# Patient Record
Sex: Female | Born: 1955 | Race: Black or African American | Hispanic: Refuse to answer | Smoking: Never smoker
Health system: Southern US, Community
[De-identification: ages and names within clinical notes are randomized; demographics above are authoritative.]

## PROBLEM LIST (undated history)

## (undated) DIAGNOSIS — M1990 Unspecified osteoarthritis, unspecified site: Secondary | ICD-10-CM

## (undated) DIAGNOSIS — I10 Essential (primary) hypertension: Secondary | ICD-10-CM

## (undated) DIAGNOSIS — E785 Hyperlipidemia, unspecified: Secondary | ICD-10-CM

## (undated) DIAGNOSIS — G4730 Sleep apnea, unspecified: Secondary | ICD-10-CM

## (undated) DIAGNOSIS — E119 Type 2 diabetes mellitus without complications: Secondary | ICD-10-CM

## (undated) HISTORY — PX: BREAST LUMPECTOMY: SHX2

## (undated) HISTORY — DX: Unspecified osteoarthritis, unspecified site: M19.90

## (undated) HISTORY — DX: Sleep apnea, unspecified: G47.30

## (undated) HISTORY — DX: Hyperlipidemia, unspecified: E78.5

---

## 1998-05-11 ENCOUNTER — Ambulatory Visit (HOSPITAL_COMMUNITY): Admission: RE | Admit: 1998-05-11 | Discharge: 1998-05-11 | Payer: Self-pay | Admitting: Family Medicine

## 1998-05-11 ENCOUNTER — Encounter: Payer: Self-pay | Admitting: Family Medicine

## 1998-10-31 ENCOUNTER — Other Ambulatory Visit: Admission: RE | Admit: 1998-10-31 | Discharge: 1998-10-31 | Payer: Self-pay | Admitting: Family Medicine

## 1999-06-02 ENCOUNTER — Ambulatory Visit (HOSPITAL_COMMUNITY): Admission: RE | Admit: 1999-06-02 | Discharge: 1999-06-02 | Payer: Self-pay | Admitting: Family Medicine

## 1999-06-02 ENCOUNTER — Encounter: Payer: Self-pay | Admitting: Family Medicine

## 1999-12-12 ENCOUNTER — Other Ambulatory Visit: Admission: RE | Admit: 1999-12-12 | Discharge: 1999-12-12 | Payer: Self-pay | Admitting: Obstetrics and Gynecology

## 2000-10-22 ENCOUNTER — Ambulatory Visit (HOSPITAL_COMMUNITY): Admission: RE | Admit: 2000-10-22 | Discharge: 2000-10-22 | Payer: Self-pay | Admitting: Family Medicine

## 2000-10-22 ENCOUNTER — Encounter: Payer: Self-pay | Admitting: Family Medicine

## 2000-12-24 ENCOUNTER — Other Ambulatory Visit: Admission: RE | Admit: 2000-12-24 | Discharge: 2000-12-24 | Payer: Self-pay | Admitting: Obstetrics and Gynecology

## 2001-10-23 ENCOUNTER — Ambulatory Visit (HOSPITAL_COMMUNITY): Admission: RE | Admit: 2001-10-23 | Discharge: 2001-10-23 | Payer: Self-pay | Admitting: Family Medicine

## 2001-10-23 ENCOUNTER — Encounter: Payer: Self-pay | Admitting: Family Medicine

## 2002-10-26 ENCOUNTER — Ambulatory Visit (HOSPITAL_COMMUNITY): Admission: RE | Admit: 2002-10-26 | Discharge: 2002-10-26 | Payer: Self-pay | Admitting: Obstetrics and Gynecology

## 2002-10-26 ENCOUNTER — Encounter: Payer: Self-pay | Admitting: Obstetrics and Gynecology

## 2003-09-27 ENCOUNTER — Encounter: Admission: RE | Admit: 2003-09-27 | Discharge: 2003-09-27 | Payer: Self-pay | Admitting: Internal Medicine

## 2003-12-15 ENCOUNTER — Encounter: Admission: RE | Admit: 2003-12-15 | Discharge: 2003-12-15 | Payer: Self-pay | Admitting: Internal Medicine

## 2003-12-30 ENCOUNTER — Ambulatory Visit (HOSPITAL_COMMUNITY): Admission: RE | Admit: 2003-12-30 | Discharge: 2003-12-30 | Payer: Self-pay | Admitting: Obstetrics and Gynecology

## 2004-07-17 ENCOUNTER — Ambulatory Visit (HOSPITAL_COMMUNITY): Admission: RE | Admit: 2004-07-17 | Discharge: 2004-07-17 | Payer: Self-pay | Admitting: Pulmonary Disease

## 2005-01-02 ENCOUNTER — Ambulatory Visit (HOSPITAL_COMMUNITY): Admission: RE | Admit: 2005-01-02 | Discharge: 2005-01-02 | Payer: Self-pay | Admitting: Pulmonary Disease

## 2005-01-15 ENCOUNTER — Encounter: Admission: RE | Admit: 2005-01-15 | Discharge: 2005-01-15 | Payer: Self-pay | Admitting: Pulmonary Disease

## 2005-09-03 ENCOUNTER — Encounter: Admission: RE | Admit: 2005-09-03 | Discharge: 2005-09-03 | Payer: Self-pay | Admitting: Orthopaedic Surgery

## 2005-09-21 ENCOUNTER — Encounter: Admission: RE | Admit: 2005-09-21 | Discharge: 2005-09-21 | Payer: Self-pay | Admitting: *Deleted

## 2005-10-11 IMAGING — CR DG LUMBAR SPINE COMPLETE 4+V
5 series · 5 of 5 positions shown · non-contrast
Comparison: none

CLINICAL DATA: Chronic back pain radiating to both hips.
 LUMBAR SPINE - 5 VIEW:
 Five film study of the lumbar spine in four projections reveals moderate scoliosis of the thoracolumbar junction with a convexity to the right.  Satisfactory alignment noted in the lateral projection.  Slight degenerative changes are noted without acute or focal abnormality.  The pars intra-articulares appear intact.

[t l-spine a.p.]
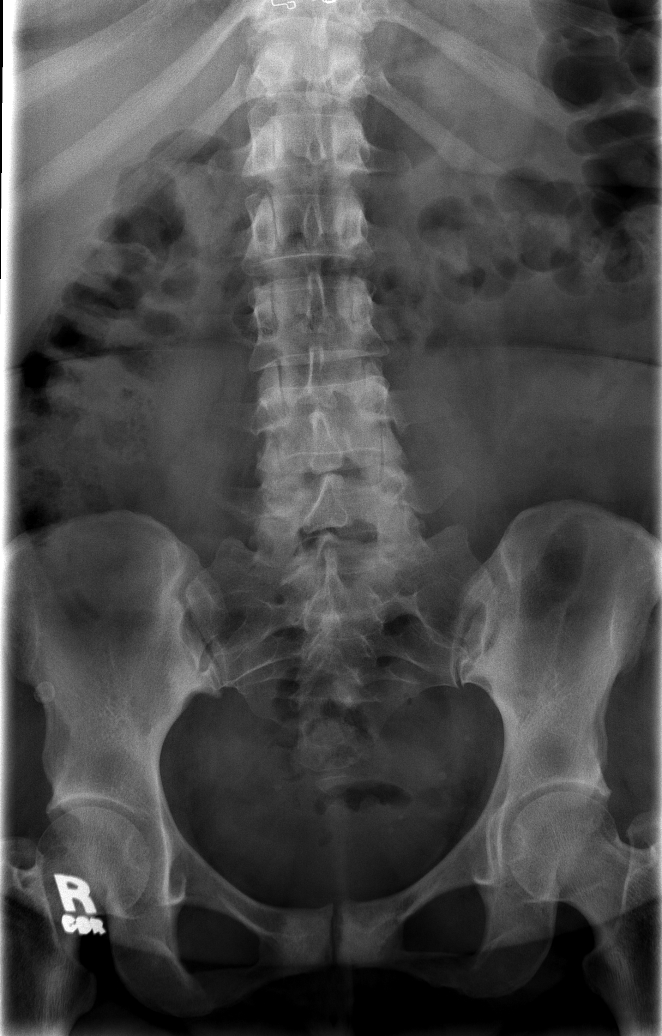

[t l-spine oblique exposure]
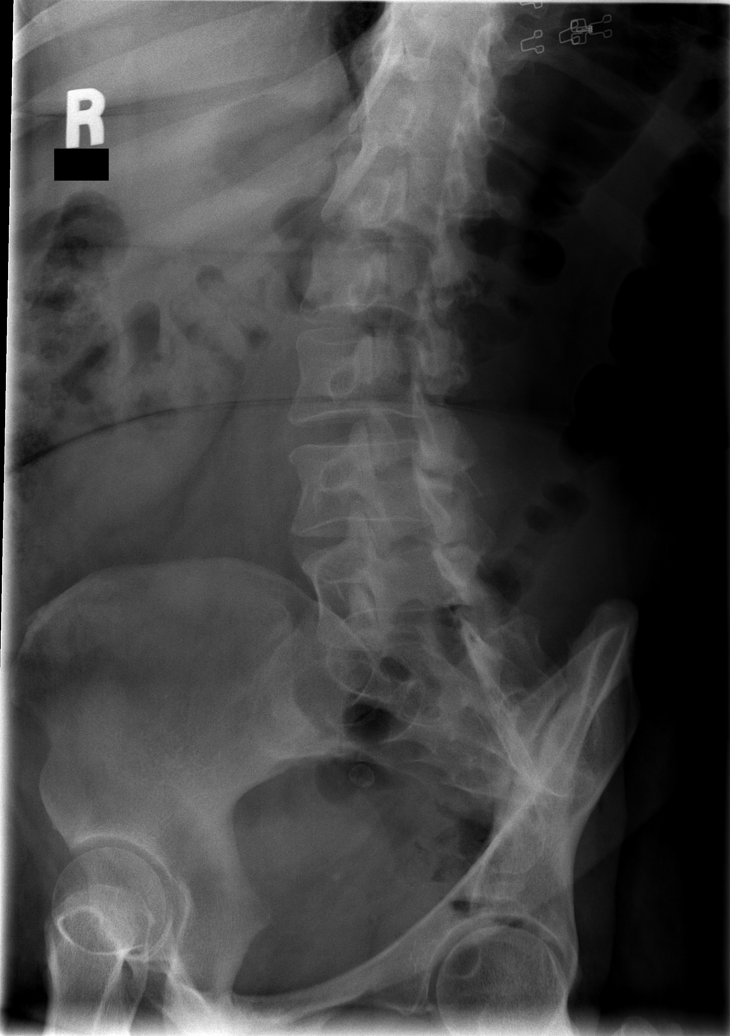

[t l-spine oblique exposure *]
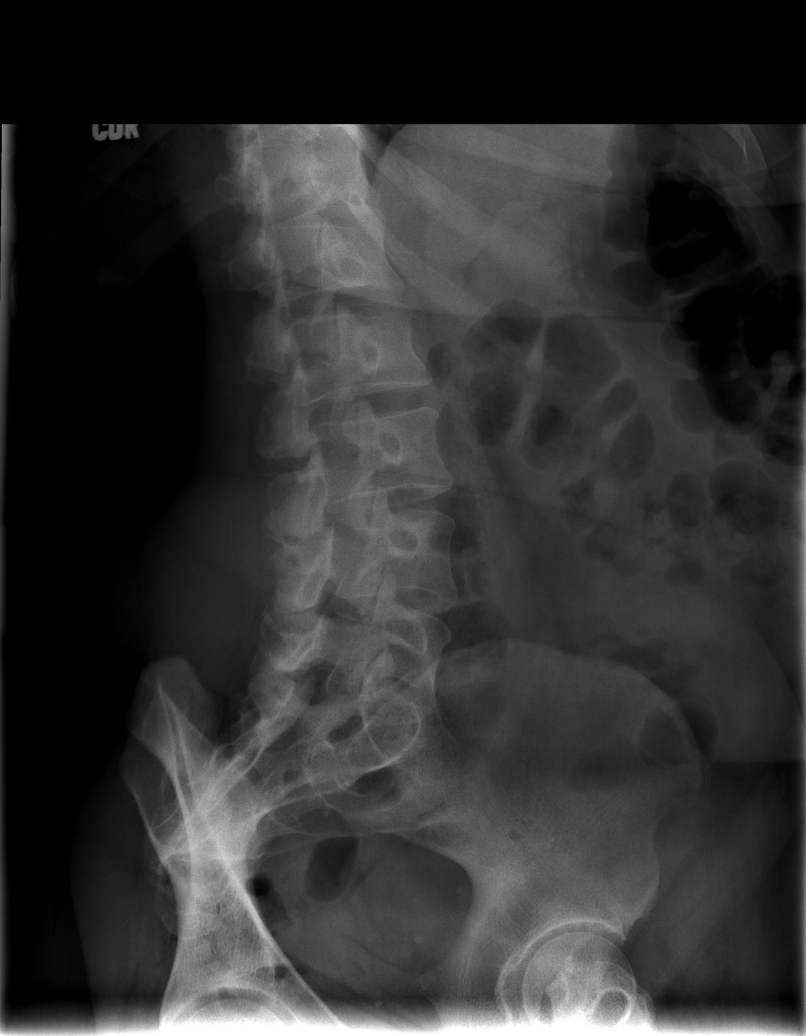

[t l-spine lat]
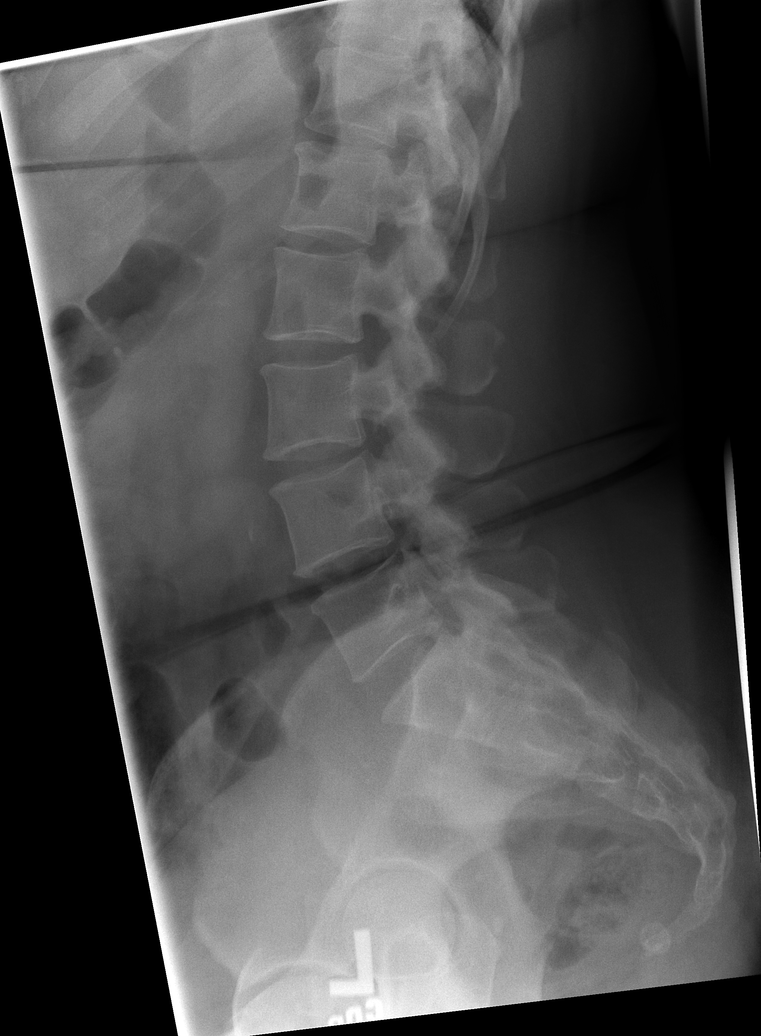

[t l-spine l5-s1 spot]
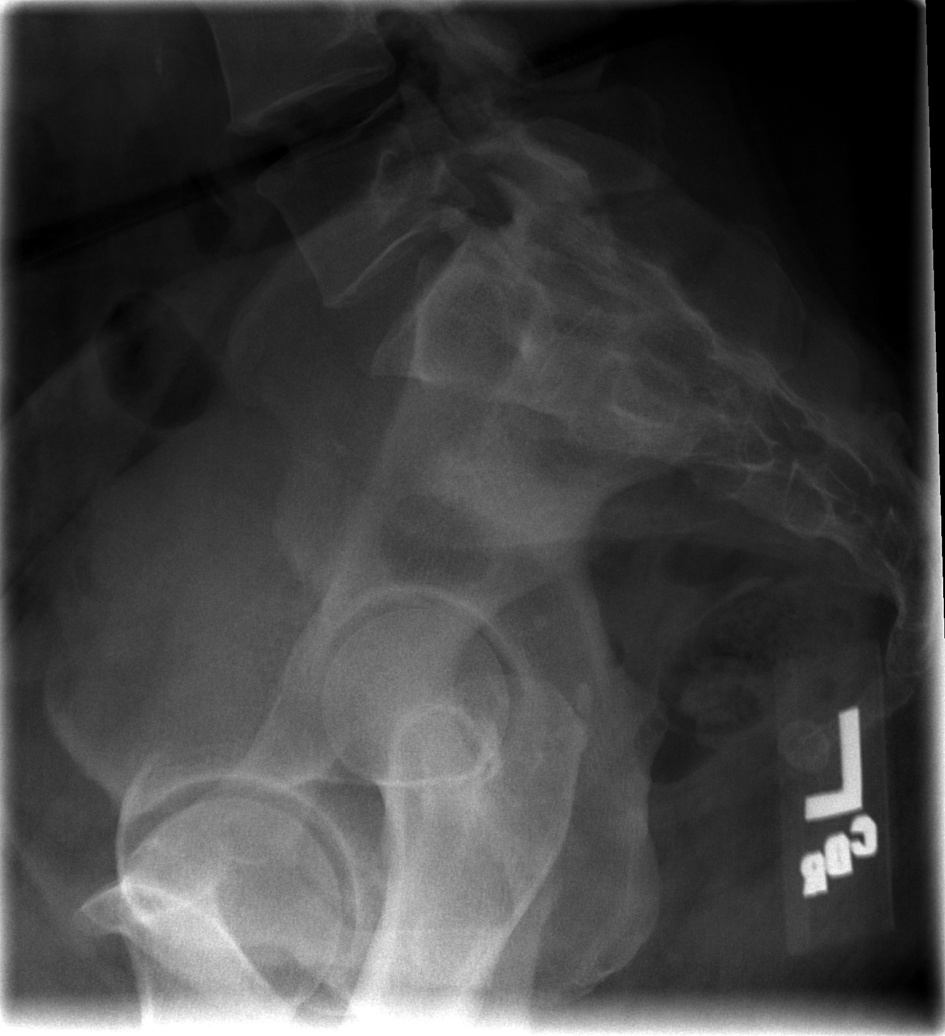

[5 of 5 positions shown; findings below may reference images not displayed]

IMPRESSION: Scoliosis of the thoracolumbar spine is noted with a convexity to the right.  Degenerative changes are noted without acute or focal abnormality.

## 2006-01-18 ENCOUNTER — Ambulatory Visit (HOSPITAL_COMMUNITY): Admission: RE | Admit: 2006-01-18 | Discharge: 2006-01-18 | Payer: Self-pay | Admitting: Pulmonary Disease

## 2007-01-21 ENCOUNTER — Ambulatory Visit (HOSPITAL_COMMUNITY): Admission: RE | Admit: 2007-01-21 | Discharge: 2007-01-21 | Payer: Self-pay | Admitting: Pulmonary Disease

## 2008-01-22 ENCOUNTER — Ambulatory Visit (HOSPITAL_COMMUNITY): Admission: RE | Admit: 2008-01-22 | Discharge: 2008-01-22 | Payer: Self-pay | Admitting: Pulmonary Disease

## 2009-02-03 ENCOUNTER — Ambulatory Visit (HOSPITAL_COMMUNITY): Admission: RE | Admit: 2009-02-03 | Discharge: 2009-02-03 | Payer: Self-pay | Admitting: Pulmonary Disease

## 2010-02-06 ENCOUNTER — Ambulatory Visit (HOSPITAL_COMMUNITY)
Admission: RE | Admit: 2010-02-06 | Discharge: 2010-02-06 | Payer: Self-pay | Source: Home / Self Care | Admitting: Pulmonary Disease

## 2010-11-06 ENCOUNTER — Inpatient Hospital Stay (INDEPENDENT_AMBULATORY_CARE_PROVIDER_SITE_OTHER)
Admission: RE | Admit: 2010-11-06 | Discharge: 2010-11-06 | Disposition: A | Discharge status: Home / Self Care | Payer: BC Managed Care – PPO | Source: Ambulatory Visit | Attending: Family Medicine | Admitting: Family Medicine

## 2010-11-06 DIAGNOSIS — R6889 Other general symptoms and signs: Secondary | ICD-10-CM

## 2011-01-02 ENCOUNTER — Other Ambulatory Visit (HOSPITAL_COMMUNITY): Payer: Self-pay | Admitting: Pulmonary Disease

## 2011-01-02 DIAGNOSIS — Z1231 Encounter for screening mammogram for malignant neoplasm of breast: Secondary | ICD-10-CM

## 2011-02-08 ENCOUNTER — Ambulatory Visit (HOSPITAL_COMMUNITY)
Admission: RE | Admit: 2011-02-08 | Discharge: 2011-02-08 | Disposition: A | Discharge status: Home / Self Care | Payer: BC Managed Care – PPO | Source: Ambulatory Visit | Attending: Pulmonary Disease | Admitting: Pulmonary Disease

## 2011-02-08 DIAGNOSIS — Z1231 Encounter for screening mammogram for malignant neoplasm of breast: Secondary | ICD-10-CM

## 2011-03-13 ENCOUNTER — Other Ambulatory Visit (HOSPITAL_COMMUNITY): Payer: Self-pay | Admitting: Pulmonary Disease

## 2011-03-13 DIAGNOSIS — R1030 Lower abdominal pain, unspecified: Secondary | ICD-10-CM

## 2011-03-14 ENCOUNTER — Inpatient Hospital Stay (HOSPITAL_COMMUNITY): Admission: RE | Admit: 2011-03-14 | Payer: BC Managed Care – PPO | Source: Ambulatory Visit

## 2011-03-14 ENCOUNTER — Ambulatory Visit (HOSPITAL_COMMUNITY)
Admission: RE | Admit: 2011-03-14 | Discharge: 2011-03-14 | Disposition: A | Discharge status: Home / Self Care | Payer: 59 | Source: Ambulatory Visit | Attending: Pulmonary Disease | Admitting: Pulmonary Disease

## 2011-03-14 ENCOUNTER — Other Ambulatory Visit (HOSPITAL_COMMUNITY): Payer: BC Managed Care – PPO

## 2011-03-14 DIAGNOSIS — R1030 Lower abdominal pain, unspecified: Secondary | ICD-10-CM

## 2011-03-14 DIAGNOSIS — D259 Leiomyoma of uterus, unspecified: Secondary | ICD-10-CM | POA: Insufficient documentation

## 2011-03-14 DIAGNOSIS — R109 Unspecified abdominal pain: Secondary | ICD-10-CM | POA: Insufficient documentation

## 2012-02-04 ENCOUNTER — Other Ambulatory Visit (HOSPITAL_COMMUNITY): Payer: Self-pay | Admitting: Pulmonary Disease

## 2012-02-04 DIAGNOSIS — Z1231 Encounter for screening mammogram for malignant neoplasm of breast: Secondary | ICD-10-CM

## 2012-02-18 ENCOUNTER — Ambulatory Visit (HOSPITAL_COMMUNITY)
Admission: RE | Admit: 2012-02-18 | Discharge: 2012-02-18 | Disposition: A | Discharge status: Home / Self Care | Payer: 59 | Source: Ambulatory Visit | Attending: Pulmonary Disease | Admitting: Pulmonary Disease

## 2012-02-18 DIAGNOSIS — Z1231 Encounter for screening mammogram for malignant neoplasm of breast: Secondary | ICD-10-CM

## 2012-07-17 ENCOUNTER — Emergency Department (INDEPENDENT_AMBULATORY_CARE_PROVIDER_SITE_OTHER): Payer: 59

## 2012-07-17 ENCOUNTER — Emergency Department (INDEPENDENT_AMBULATORY_CARE_PROVIDER_SITE_OTHER)
Admission: EM | Admit: 2012-07-17 | Discharge: 2012-07-17 | Disposition: A | Discharge status: Home / Self Care | Payer: 59 | Source: Home / Self Care

## 2012-07-17 ENCOUNTER — Encounter (HOSPITAL_COMMUNITY): Payer: Self-pay

## 2012-07-17 DIAGNOSIS — M79671 Pain in right foot: Secondary | ICD-10-CM

## 2012-07-17 DIAGNOSIS — M79609 Pain in unspecified limb: Secondary | ICD-10-CM

## 2012-07-17 HISTORY — DX: Essential (primary) hypertension: I10

## 2012-07-17 HISTORY — DX: Type 2 diabetes mellitus without complications: E11.9

## 2012-07-17 MED ORDER — NAPROXEN 500 MG PO TABS: 500.0000 mg | ORAL_TABLET | Freq: Two times a day (BID) | ORAL | Status: DC

## 2012-07-17 MED ORDER — HYDROCODONE-ACETAMINOPHEN 5-325 MG PO TABS: 1.0000 | ORAL_TABLET | Freq: Four times a day (QID) | ORAL | Status: DC | PRN

## 2012-07-17 NOTE — ED Notes (Signed)
State she woke this AM w pain in her right foot; no known injury. 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

## 2012-07-17 NOTE — ED Provider Notes (Signed)
History     CSN: [id number]  Arrival date & time 07/17/12  1359   First MD Initiated Contact with Patient 07/17/12 1614      Chief Complaint  Patient presents with  . Foot Pain    (Consider location/radiation/quality/duration/timing/severity/associated sxs/prior treatment) Patient is a 57 y.o. female presenting with lower extremity pain.  Foot Pain   Patient is a 57 year old female with history of hypertension, diabetes presented to urgent care with swelling and pain in her right foot that started this morning around 9:30 AM. Patient reports that she woke up today and noticed right foot swelling and then significant pain on bearing weight. Patient denies any fall, tripping on the right foot/ankle or any bug bite. She denied any fevers, chills. She denies any prior history of gout.   Past Medical History  Diagnosis Date  . Hypertension   . Diabetes mellitus without complication     History reviewed. No pertinent past surgical history.  History reviewed. No pertinent family history.  History  Substance Use Topics  . Smoking status: Not on file  . Smokeless tobacco: Not on file  . Alcohol Use: Not on file    OB History   Grav Para Term Preterm Abortions TAB SAB Ect Mult Living                  Review of Systems  Constitutional: Negative.   HENT: Negative.   Eyes: Negative.   Respiratory: Negative.   Cardiovascular: Negative.   Gastrointestinal: Negative.   Endocrine: Negative.   Genitourinary: Negative.   Musculoskeletal: Positive for joint swelling, arthralgias and gait problem.       In the right foot, please see history of present illness  Neurological: Negative.     Allergies  Review of patient's allergies indicates no known allergies.  Home Medications   Current Outpatient Rx  Name  Route  Sig  Dispense  Refill  . aspirin 81 MG tablet   Oral   Take 81 mg by mouth daily.         . metFORMIN (GLUCOPHAGE) 500 MG tablet   Oral   Take 500 mg by  mouth 2 (two) times daily with a meal.         . metoprolol succinate (TOPROL-XL) 100 MG 24 hr tablet   Oral   Take 100 mg by mouth daily. Take with or immediately following a meal.           BP 159/76  Pulse 74  Temp(Src) 98.2 F (36.8 C) (Oral)  Resp 16  SpO2 100%  Physical Exam  Constitutional: She appears well-developed and well-nourished.  HENT:  Head: Normocephalic and atraumatic.  Eyes: EOM are normal. Pupils are equal, round, and reactive to light.  Neck: Neck supple.  Cardiovascular: Normal rate and regular rhythm.   Pulmonary/Chest: Effort normal and breath sounds normal.  Abdominal: Soft. Bowel sounds are normal.  Musculoskeletal:  Right foot is swollen mostly around the ankle joint, slight ROM limitation on abduction and adduction due to pain    ED Course  Procedures (including critical care time)  Labs Reviewed  SEDIMENTATION RATE  URIC ACID   Dg Foot Complete Right  07/17/2012   *RADIOLOGY REPORT*  Clinical Data: Right foot pain anteriorly radiating posteriorly when walking or weightbearing, no known injury  RIGHT FOOT COMPLETE - 3+ VIEW  Comparison: None  Findings: Osseous mineralization normal. Joint spaces preserved. Plantar and Achilles insertion calcaneal spurs. No acute fracture, dislocation or bone destruction.  IMPRESSION: Calcaneal spurring. No acute osseous abnormalities.   Original Report Authenticated By: Ulyses Southward, M.D.     1. Right foot pain       MDM  Right foot pain with swelling- sudden onset without any trauma, does not fit cellulitis picture - right foot x-ray shows no acute fracture, dislocation or bone destruction, calcaneal spurring - ESR, serum uric acid still pending - Will prescribe naprosyn and hydrocodone PRN  (#30, no refills) for pain. I will followup on ESR, uric acid level today, if patient has gout, I will call in prescription for colchicine to her pharmacy, [facility] [address] - Patient was advised to rest, elevate  her foot, weight bearing as tolerated, ice pack to reduce swelling and pain.   RAI,RIPUDEEP M.D. [facility] 07/17/2012, 5:16 PM  Pager: [phone]          Cathren Harsh, MD 07/17/12 [phone]

## 2012-09-22 ENCOUNTER — Encounter (INDEPENDENT_AMBULATORY_CARE_PROVIDER_SITE_OTHER): Payer: 59 | Admitting: Ophthalmology

## 2012-09-22 DIAGNOSIS — H2510 Age-related nuclear cataract, unspecified eye: Secondary | ICD-10-CM

## 2012-09-22 DIAGNOSIS — H43819 Vitreous degeneration, unspecified eye: Secondary | ICD-10-CM

## 2012-09-22 DIAGNOSIS — E1165 Type 2 diabetes mellitus with hyperglycemia: Secondary | ICD-10-CM

## 2012-09-22 DIAGNOSIS — I10 Essential (primary) hypertension: Secondary | ICD-10-CM

## 2012-09-22 DIAGNOSIS — H33309 Unspecified retinal break, unspecified eye: Secondary | ICD-10-CM

## 2012-09-22 DIAGNOSIS — E1139 Type 2 diabetes mellitus with other diabetic ophthalmic complication: Secondary | ICD-10-CM

## 2012-09-22 DIAGNOSIS — H35039 Hypertensive retinopathy, unspecified eye: Secondary | ICD-10-CM

## 2012-09-22 DIAGNOSIS — E11319 Type 2 diabetes mellitus with unspecified diabetic retinopathy without macular edema: Secondary | ICD-10-CM

## 2012-10-08 ENCOUNTER — Ambulatory Visit (INDEPENDENT_AMBULATORY_CARE_PROVIDER_SITE_OTHER): Payer: 59 | Admitting: Ophthalmology

## 2012-10-16 ENCOUNTER — Ambulatory Visit (INDEPENDENT_AMBULATORY_CARE_PROVIDER_SITE_OTHER): Payer: 59 | Admitting: Ophthalmology

## 2012-10-16 DIAGNOSIS — H33309 Unspecified retinal break, unspecified eye: Secondary | ICD-10-CM

## 2013-01-12 ENCOUNTER — Other Ambulatory Visit (HOSPITAL_COMMUNITY): Payer: Self-pay | Admitting: Pulmonary Disease

## 2013-01-12 DIAGNOSIS — Z1231 Encounter for screening mammogram for malignant neoplasm of breast: Secondary | ICD-10-CM

## 2013-02-16 ENCOUNTER — Ambulatory Visit (INDEPENDENT_AMBULATORY_CARE_PROVIDER_SITE_OTHER): Payer: 59 | Admitting: Ophthalmology

## 2013-02-16 DIAGNOSIS — E1139 Type 2 diabetes mellitus with other diabetic ophthalmic complication: Secondary | ICD-10-CM

## 2013-02-16 DIAGNOSIS — H2510 Age-related nuclear cataract, unspecified eye: Secondary | ICD-10-CM

## 2013-02-16 DIAGNOSIS — H43819 Vitreous degeneration, unspecified eye: Secondary | ICD-10-CM

## 2013-02-16 DIAGNOSIS — I10 Essential (primary) hypertension: Secondary | ICD-10-CM

## 2013-02-16 DIAGNOSIS — E11319 Type 2 diabetes mellitus with unspecified diabetic retinopathy without macular edema: Secondary | ICD-10-CM

## 2013-02-16 DIAGNOSIS — H33309 Unspecified retinal break, unspecified eye: Secondary | ICD-10-CM

## 2013-02-16 DIAGNOSIS — H35039 Hypertensive retinopathy, unspecified eye: Secondary | ICD-10-CM

## 2013-02-17 ENCOUNTER — Other Ambulatory Visit: Payer: Self-pay | Admitting: Family Medicine

## 2013-02-17 DIAGNOSIS — R32 Unspecified urinary incontinence: Secondary | ICD-10-CM

## 2013-02-23 ENCOUNTER — Ambulatory Visit (HOSPITAL_COMMUNITY)
Admission: RE | Admit: 2013-02-23 | Discharge: 2013-02-23 | Disposition: A | Discharge status: Home / Self Care | Payer: 59 | Source: Ambulatory Visit | Attending: Pulmonary Disease | Admitting: Pulmonary Disease

## 2013-02-23 DIAGNOSIS — Z1231 Encounter for screening mammogram for malignant neoplasm of breast: Secondary | ICD-10-CM

## 2013-02-24 ENCOUNTER — Ambulatory Visit
Admission: RE | Admit: 2013-02-24 | Discharge: 2013-02-24 | Disposition: A | Discharge status: Home / Self Care | Payer: 59 | Source: Ambulatory Visit | Attending: Family Medicine | Admitting: Family Medicine

## 2013-02-24 DIAGNOSIS — R32 Unspecified urinary incontinence: Secondary | ICD-10-CM

## 2013-06-02 ENCOUNTER — Encounter (HOSPITAL_COMMUNITY): Payer: Self-pay | Admitting: Emergency Medicine

## 2013-06-02 ENCOUNTER — Emergency Department (INDEPENDENT_AMBULATORY_CARE_PROVIDER_SITE_OTHER)
Admission: EM | Admit: 2013-06-02 | Discharge: 2013-06-02 | Disposition: A | Discharge status: Home / Self Care | Payer: 59 | Source: Home / Self Care | Attending: Emergency Medicine | Admitting: Emergency Medicine

## 2013-06-02 ENCOUNTER — Emergency Department (INDEPENDENT_AMBULATORY_CARE_PROVIDER_SITE_OTHER): Payer: 59

## 2013-06-02 DIAGNOSIS — S93409A Sprain of unspecified ligament of unspecified ankle, initial encounter: Secondary | ICD-10-CM

## 2013-06-02 MED ORDER — IBUPROFEN 800 MG PO TABS
800.0000 mg | ORAL_TABLET | Freq: Once | ORAL | Status: AC
  Administered 2013-06-02: 800 mg via ORAL

## 2013-06-02 MED ORDER — HYDROCODONE-ACETAMINOPHEN 5-325 MG PO TABS: ORAL_TABLET | ORAL | Status: DC

## 2013-06-02 MED ORDER — IBUPROFEN 800 MG PO TABS
ORAL_TABLET | ORAL | Status: AC
  Filled 2013-06-02: qty 1

## 2013-06-02 NOTE — ED Provider Notes (Signed)
Chief Complaint   Chief Complaint  Patient presents with  . Ankle Pain    History of Present Illness   Shelly Garza is a 58 year old female who injured her right ankle today. She tripped while getting into her car on some uneven pavement while she was having an oil change. She fell to the ground. She's not sure how she injured the ankle. She did not hear a pop. The entire ankle has been swollen since then and tender to touch and with movement. There is no numbness or tingling. She is able to bear weight, but it hurts.  Review of Systems   Other than as noted above, the patient denies any of the following symptoms: Systemic:  No fevers or chills.   Musculoskeletal:  No joint pain or swelling, back pain, or neck pain. Neurological:  No muscular weakness or paresthesias.  [place]   Past medical history, family history, social history, meds, and allergies were reviewed. She has diabetes and hypertension and takes Tribenzor, metformin, metoprolol, and naproxen.  Physical Examination     Vital signs:  BP 148/83  Pulse 72  Temp(Src) 98.1 F (36.7 C) (Oral)  Resp 20  SpO2 97% Gen:  Alert and oriented times 3.  In no distress. Musculoskeletal: Exam of the ankle reveals the entire ankle is puffy and tender to touch. There is no bruising. There is tenderness to palpation over the anterior ankle joint, lateral and medial malleolus, and the dorsum of the foot. The ankle has a full range of motion with pain. Anterior drawer sign negative.  Talar tilt negative. Squeeze test positive. Achilles tendon, peroneal tendon, and tibialis posterior were intact. Otherwise, all joints had a full a ROM with no swelling, bruising or deformity.  No edema, pulses full. Extremities were warm and pink.  Capillary refill was brisk.  Skin:  Clear, warm and dry.  No rash. Neuro:  Alert and oriented times 3.  Muscle strength was normal.  Sensation was intact to light touch.   Radiology   Dg Ankle Complete  Right  06/02/2013   CLINICAL DATA:  Fall.  Right ankle pain.  EXAM: RIGHT ANKLE - COMPLETE 3+ VIEW  COMPARISON:  DG FOOT COMPLETE*R* dated 07/17/2012  FINDINGS: Calcaneal spurs are present. There is no fracture. Ankle mortise is congruent. Talar dome intact. Lateral malleolar soft tissue swelling is present.  IMPRESSION: Lateral soft tissue swelling without osseous injury.   Electronically Signed   By: Dereck Ligas M.D.   On: 06/02/2013 15:01   I reviewed the images independently and personally and concur with the radiologist's findings.  Course in [facility] [place]     Given ASO brace and she has crutches at home.  Assessment   The encounter diagnosis was Ankle sprain.  Plan     1.  Meds:  The following meds were prescribed:   New Prescriptions   HYDROCODONE-ACETAMINOPHEN (NORCO/VICODIN) 5-325 MG PER TABLET    1 to 2 tabs every 4 to 6 hours as needed for pain.    2.  Patient Education/Counseling:  The patient was given appropriate handouts, self care instructions, including rest and activity, elevation, application of ice and compression, and instructed in symptomatic relief.    3.  Follow up:  The patient was told to follow up here if no better in 3 to 4 days, or sooner if becoming worse in any way, and given some red flag symptoms such as increasing pain or neurological symptoms which would prompt immediate return.  Follow up with Dr. Rodell Perna no better in one month.     Harden Mo, MD 06/02/13 [phone]

## 2013-06-02 NOTE — ED Notes (Signed)
Right ankle pain, fell today.

## 2013-06-02 NOTE — Discharge Instructions (Signed)

## 2013-06-08 NOTE — ED Notes (Signed)
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 Discussed w Dr Jake Michaelis, who authorized extension of work note, with understanding that she will need to return to clinic on 4-8 for recheck if unable to RTW on that date. No further extensions can be considered w/o a repeat exam . Note at front desk for patient pickup

## 2013-06-10 ENCOUNTER — Encounter (HOSPITAL_COMMUNITY): Payer: Self-pay | Admitting: Emergency Medicine

## 2013-06-10 ENCOUNTER — Emergency Department (INDEPENDENT_AMBULATORY_CARE_PROVIDER_SITE_OTHER)
Admission: EM | Admit: 2013-06-10 | Discharge: 2013-06-10 | Disposition: A | Discharge status: Home / Self Care | Payer: 59 | Source: Home / Self Care | Attending: Family Medicine | Admitting: Family Medicine

## 2013-06-10 DIAGNOSIS — S93401A Sprain of unspecified ligament of right ankle, initial encounter: Secondary | ICD-10-CM

## 2013-06-10 DIAGNOSIS — X58XXXA Exposure to other specified factors, initial encounter: Secondary | ICD-10-CM

## 2013-06-10 DIAGNOSIS — S93409A Sprain of unspecified ligament of unspecified ankle, initial encounter: Secondary | ICD-10-CM

## 2013-06-10 NOTE — ED Provider Notes (Signed)
CSN: [id number]     Arrival date & time 06/10/13  1555 History   First MD Initiated Contact with Patient 06/10/13 1727     Chief Complaint  Patient presents with  . Ankle Injury   (Consider location/radiation/quality/duration/timing/severity/associated sxs/prior Treatment) Patient is a 58 y.o. female presenting with lower extremity injury. The history is provided by the patient.  Ankle Injury This is a new problem. The current episode started more than 1 week ago (seen 3/31 for ankle sprain, unable to perform job as needed, needs further job restriction.). The problem has not changed since onset.The symptoms are aggravated by walking and standing.    Past Medical History  Diagnosis Date  . Hypertension   . Diabetes mellitus without complication    History reviewed. No pertinent past surgical history. History reviewed. No pertinent family history. History  Substance Use Topics  . Smoking status: Never Smoker   . Smokeless tobacco: Not on file  . Alcohol Use: Yes   OB History   Grav Para Term Preterm Abortions TAB SAB Ect Mult Living                 Review of Systems  Musculoskeletal: Positive for gait problem and joint swelling.  Skin: Negative.     Allergies  Review of patient's allergies indicates no known allergies.  Home Medications   Current Outpatient Rx  Name  Route  Sig  Dispense  Refill  . aspirin 81 MG tablet   Oral   Take 81 mg by mouth daily.         Marland Kitchen HYDROcodone-acetaminophen (NORCO) 5-325 MG per tablet   Oral   Take 1 tablet by mouth every 6 (six) hours as needed for pain.   30 tablet   0   . HYDROcodone-acetaminophen (NORCO/VICODIN) 5-325 MG per tablet      1 to 2 tabs every 4 to 6 hours as needed for pain.   20 tablet   0   . metFORMIN (GLUCOPHAGE) 500 MG tablet   Oral   Take 500 mg by mouth 2 (two) times daily with a meal.         . metoprolol succinate (TOPROL-XL) 100 MG 24 hr tablet   Oral   Take 100 mg by mouth daily. Take with  or immediately following a meal.         . naproxen (NAPROSYN) 500 MG tablet   Oral   Take 1 tablet (500 mg total) by mouth 2 (two) times daily with a meal.   60 tablet   1   . Olmesartan-Amlodipine-HCTZ (TRIBENZOR PO)   Oral   Take by mouth.          BP 169/88  Pulse 76  Temp(Src) 98.8 F (37.1 C) (Oral)  Resp 20  SpO2 100% Physical Exam  Nursing note and vitals reviewed. Constitutional: She is oriented to person, place, and time. She appears well-developed and well-nourished.  Musculoskeletal: She exhibits tenderness.  Tender lat ankle sts and ecchymosis with aso in place.  Neurological: She is alert and oriented to person, place, and time.  Skin: Skin is warm and dry.    ED Course  Procedures (including critical care time) Labs Review Labs Reviewed - No data to display Imaging Review No results found.   MDM   1. Sprain of right ankle       Billy Fischer, MD 06/10/13 1807

## 2013-06-10 NOTE — ED Notes (Signed)
Patient c/o she is still unable to bear weight as expected on her job w restrictions

## 2013-06-10 NOTE — Discharge Instructions (Signed)
Go to occ med in am.

## 2014-02-08 ENCOUNTER — Other Ambulatory Visit (HOSPITAL_COMMUNITY): Payer: Self-pay | Admitting: Family Medicine

## 2014-02-08 DIAGNOSIS — Z1231 Encounter for screening mammogram for malignant neoplasm of breast: Secondary | ICD-10-CM

## 2014-03-02 ENCOUNTER — Ambulatory Visit (HOSPITAL_COMMUNITY)
Admission: RE | Admit: 2014-03-02 | Discharge: 2014-03-02 | Disposition: A | Discharge status: Home / Self Care | Payer: 59 | Source: Ambulatory Visit | Attending: Family Medicine | Admitting: Family Medicine

## 2014-03-02 DIAGNOSIS — Z1231 Encounter for screening mammogram for malignant neoplasm of breast: Secondary | ICD-10-CM | POA: Insufficient documentation

## 2014-05-20 IMAGING — MG MM DIGITAL SCREENING BILAT
4 series · 4 of 4 positions shown · non-contrast
Comparison: Previous exam(s)

ACR Breast Density Category a: The breast tissue is almost entirely
fatty.

CLINICAL DATA: Screening. Family history of breast cancer in her
mother at age 52 and sister at age 34. Prior benign excisional
biopsy of the right breast

EXAM:
DIGITAL SCREENING BILATERAL MAMMOGRAM WITH CAD

[R CC]
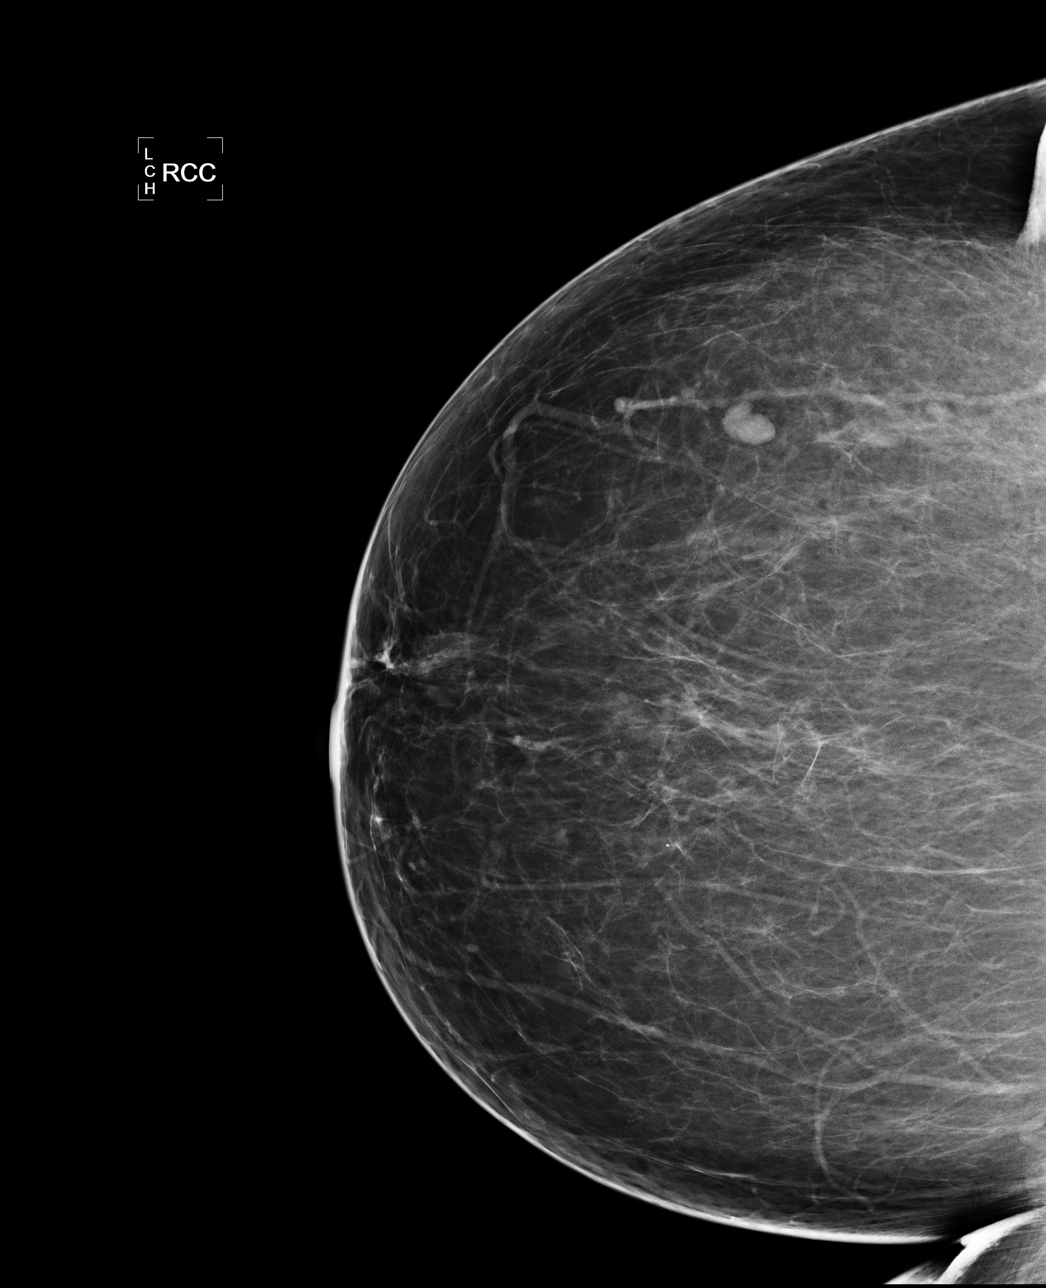

[R MLO]
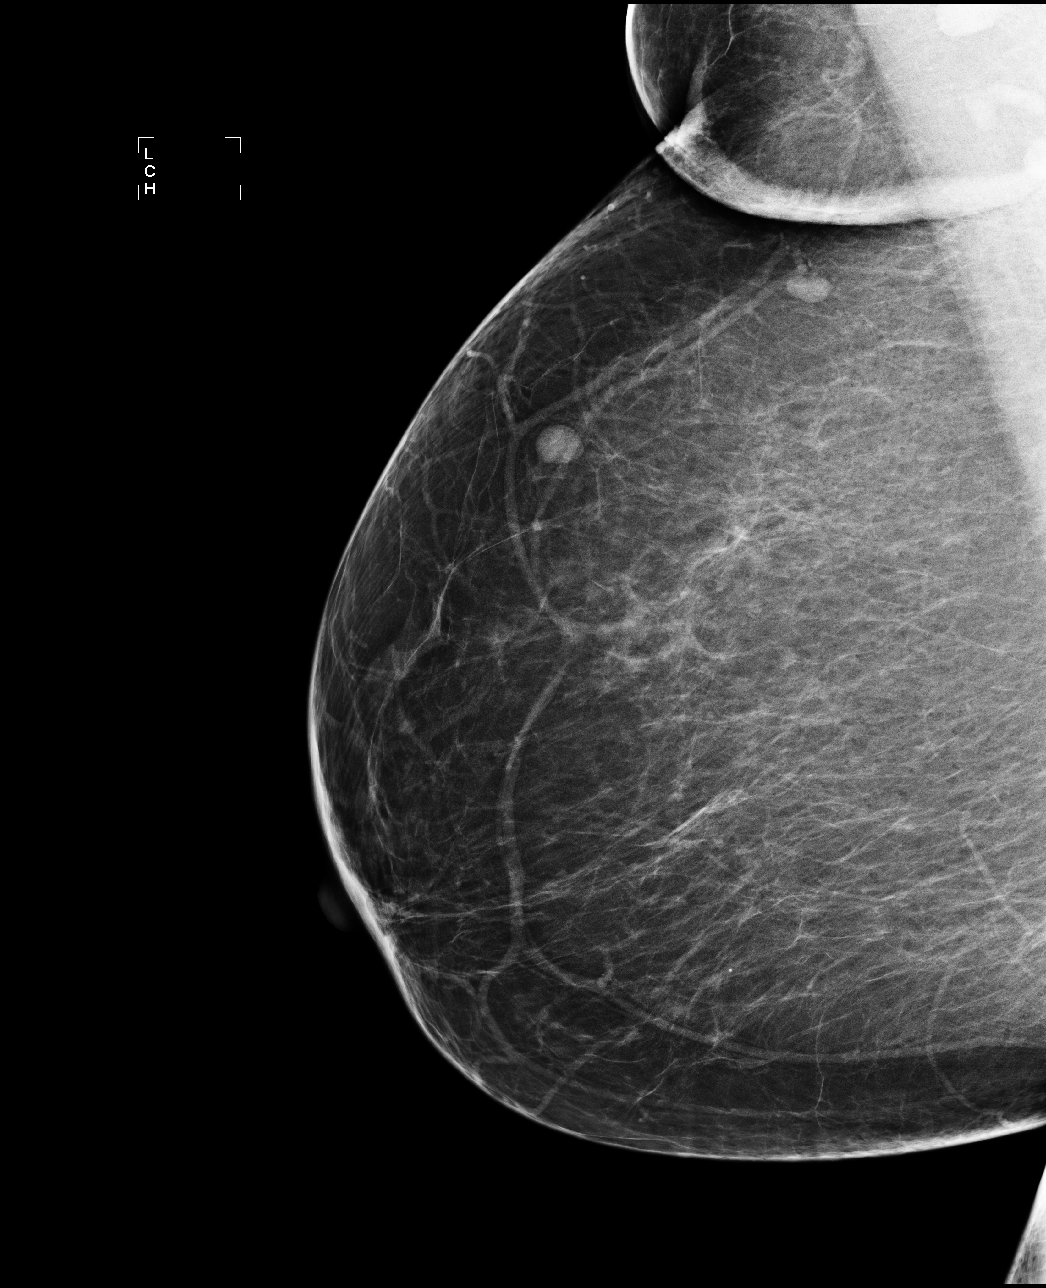

[L CC]
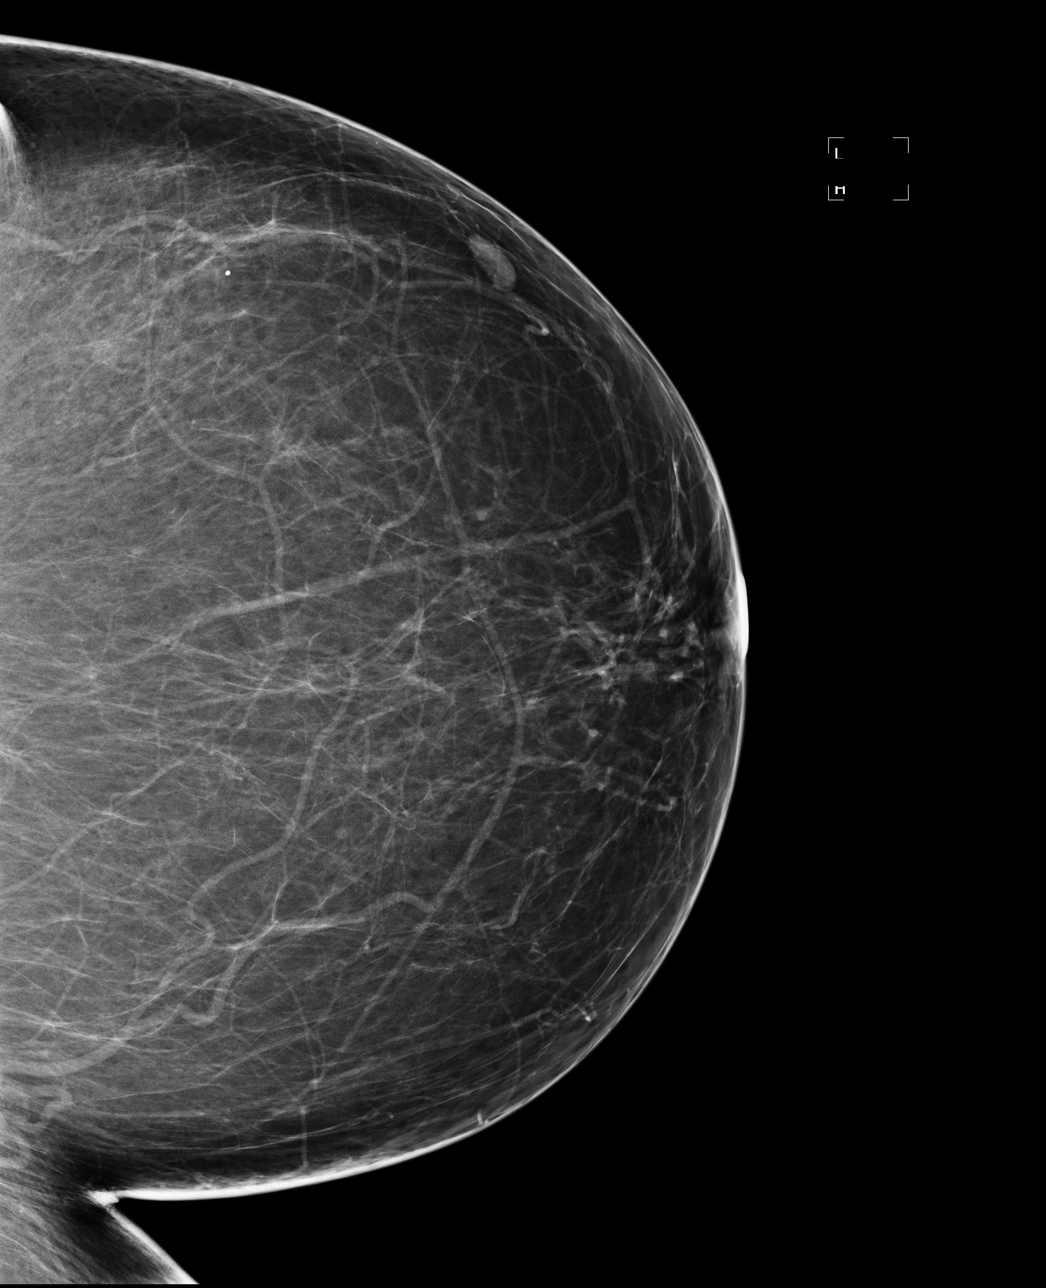

[L MLO]
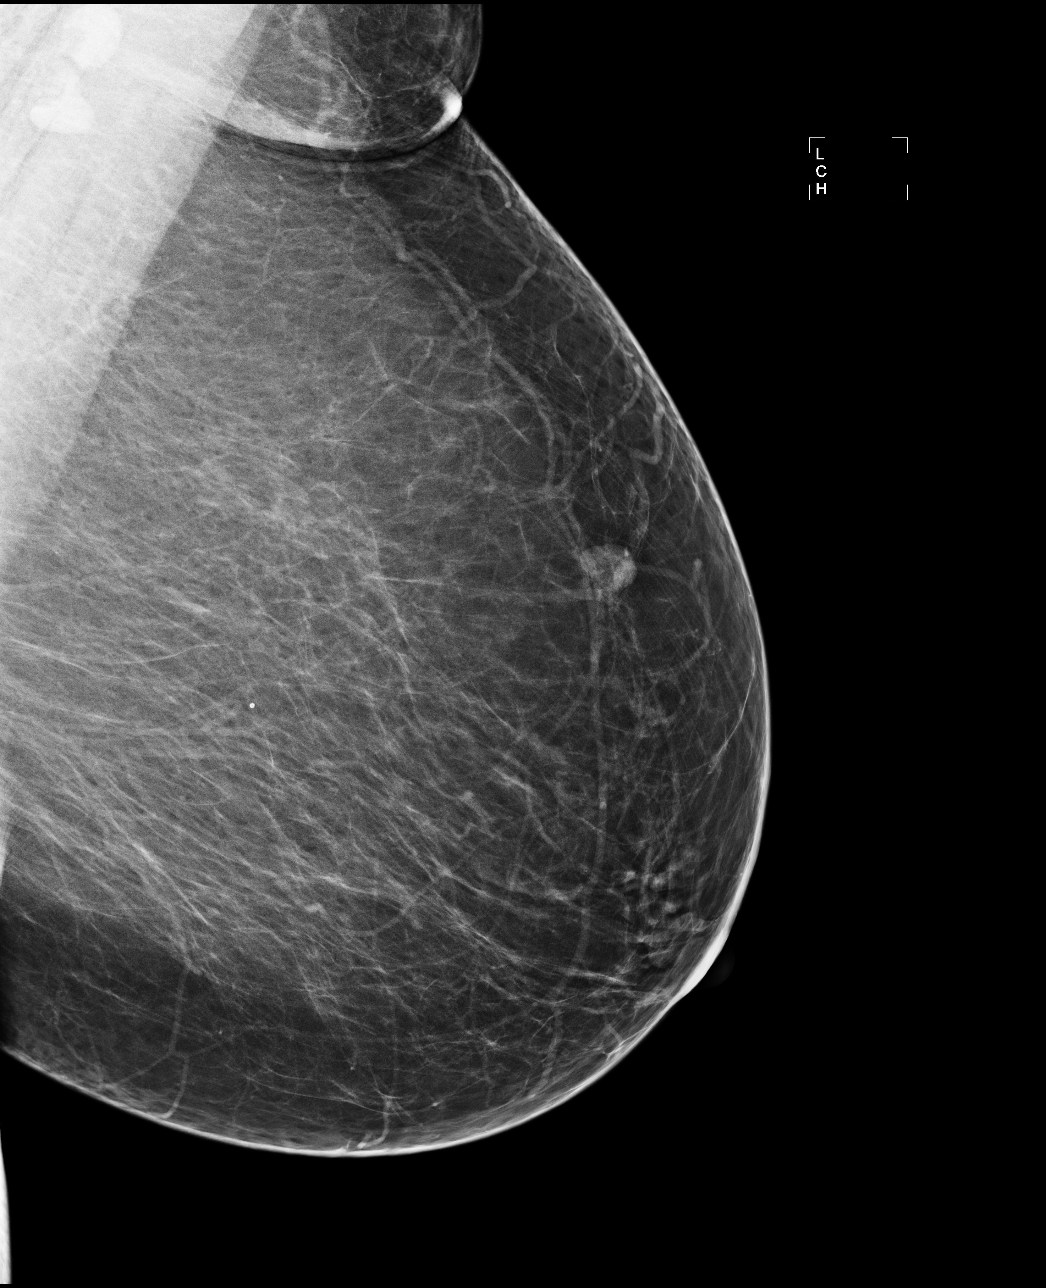

[4 of 4 positions shown; findings below may reference images not displayed]

FINDINGS: There are no findings suspicious for malignancy. Images were
processed with CAD.
IMPRESSION: No mammographic evidence of malignancy. A result letter of this
screening mammogram will be mailed directly to the patient.

RECOMMENDATION:
Screening mammogram in one year. (Code:ME-W-DFB)

BI-RADS CATEGORY  1: Negative

## 2014-07-28 ENCOUNTER — Telehealth: Payer: Self-pay | Admitting: Internal Medicine

## 2014-07-28 NOTE — Telephone Encounter (Signed)
Received records from [facility] for appointment on 09/02/14 with Dr Debara Pickett.  Records given to [facility] (medical records) for Dr Lysbeth Penner schedule on 09/02/14. lp

## 2014-09-02 ENCOUNTER — Encounter: Payer: Self-pay | Admitting: Internal Medicine

## 2014-09-02 ENCOUNTER — Ambulatory Visit (INDEPENDENT_AMBULATORY_CARE_PROVIDER_SITE_OTHER): Payer: 59 | Admitting: Internal Medicine

## 2014-09-02 VITALS — BP 160/100 | HR 61 | Ht 59.0 in | Wt 171.9 lb

## 2014-09-02 DIAGNOSIS — R011 Cardiac murmur, unspecified: Secondary | ICD-10-CM

## 2014-09-02 DIAGNOSIS — R0609 Other forms of dyspnea: Secondary | ICD-10-CM

## 2014-09-02 NOTE — Progress Notes (Signed)
OFFICE NOTE  Chief Complaint:  Murmur, DOE  Primary Care Physician: Antonietta Jewel, MD  HPI:  Shelly Garza is a pleasant 59 year old female, presents today for evaluation of murmur. She also reports progressive shortness of breath over the past several months. Occasionally gets some fluttering in her chest but this happens maybe about once a month. She's not sure if this was related to anxiety or not. Past medical history is significant for diabetes and hypertension. In addition she is obese with a BMI of 34. Blood pressure today is elevated at 160/100. She says this is unusual for her. Family history is significant for hypertension in her father and a fortunately breast cancer in her mother who died at a young age and her sister who also died at a young age.  PMHx:  Past Medical History  Diagnosis Date  . Hypertension   . Diabetes mellitus without complication     History reviewed. No pertinent past surgical history.  FAMHx:  Family History  Problem Relation Age of Onset  . Cancer Mother   . Hypertension Father   . Cancer Sister     SOCHx:   reports that she quit smoking about 30 years ago. Her smoking use included Cigarettes. She quit after 2 years of use. She has never used smokeless tobacco. She reports that she drinks alcohol. She reports that she does not use illicit drugs.  ALLERGIES:  No Known Allergies  ROS: A comprehensive review of systems was negative except for: Respiratory: positive for dyspnea on exertion  HOME MEDS: Current Outpatient Prescriptions  Medication Sig Dispense Refill  . amitriptyline (ELAVIL) 75 MG tablet Take 75 mg by mouth at bedtime.  0  . aspirin 81 MG tablet Take 81 mg by mouth daily.    Marland Kitchen atenolol (TENORMIN) 50 MG tablet Take 50 mg by mouth daily.    Marland Kitchen DILT-XR 180 MG 24 hr capsule Take 1 capsule by mouth daily.    . hydrochlorothiazide (HYDRODIURIL) 25 MG tablet Take 1 tablet by mouth daily.    Marland Kitchen lisinopril (PRINIVIL,ZESTRIL) 20  MG tablet Take 1 tablet by mouth daily.    Marland Kitchen lovastatin (MEVACOR) 20 MG tablet Take 20 mg by mouth daily.    . metFORMIN (GLUCOPHAGE) 500 MG tablet Take 500 mg by mouth 2 (two) times daily with a meal.    . traMADol (ULTRAM) 50 MG tablet Take 50 mg by mouth 2 (two) times daily as needed.  0   No current facility-administered medications for this visit.    LABS/IMAGING: No results found for this or any previous visit (from the past 48 hour(s)). No results found.  WEIGHTS: Wt Readings from Last 3 Encounters:  09/02/14 171 lb 14.4 oz (77.973 kg)    VITALS: BP 160/100 mmHg  Pulse 61  Ht 4\' 11"  (1.499 m)  Wt 171 lb 14.4 oz (77.973 kg)  BMI 34.70 kg/m2  EXAM: General appearance: alert and no distress Neck: no carotid bruit, no JVD and thyroid not enlarged, symmetric, no tenderness/mass/nodules Lungs: clear to auscultation bilaterally Heart: regular rate and rhythm, S1, S2 normal and systolic murmur: early systolic 2/6, blowing at apex Abdomen: soft, non-tender; bowel sounds normal; no masses,  no organomegaly and obese Extremities: extremities normal, atraumatic, no cyanosis or edema Pulses: 2+ and symmetric Skin: Skin color, texture, turgor normal. No rashes or lesions Neurologic: Grossly normal Psych: Normal  EKG: Normal sinus rhythm at 61, voltage criteria for LVH, nonspecific T-wave changes  ASSESSMENT: 1. Progressive dyspnea on  exertion 2. Obesity 3. Murmur 4. Hypertension-uncontrolled 5. Diabetes type 2  PLAN: 1.   Mrs. Cogbill has a number of cardiac risk factors and describing some progressive dyspnea on exertion, specifically when walking upstairs or doing household chores. This could be related to wait deconditioning however I like her to undergo an exercise treadmill stress test. In addition she's noted have a soft systolic murmur today. I recommend an echocardiogram to further evaluate this. Blood pressure is also elevated and may need further treatment when she  returns. I've asked her to take blood pressures at home and bring those numbers back with her. Plan to see her back in a few weeks to discuss results of her studies.  Thanks's as always for the kind referral.  Pixie Casino, MD, [facility] Attending Cardiologist [place] 09/02/2014, 1:40 PM

## 2014-09-02 NOTE — Patient Instructions (Signed)
Dr. Debara Pickett has ordered an echocardiogram and exercise tolerance test. Both tests are done in our office.   Dr. Debara Pickett would like you to monitor your blood pressure at home and bring a list of these readings to your next visit.   Dr. Debara Pickett would like you to follow up after your tests.   Your physician has requested that you have an echocardiogram. Echocardiography is a painless test that uses sound waves to create images of your heart. It provides your doctor with information about the size and shape of your heart and how well your heart's chambers and valves are working. This procedure takes approximately one hour. There are no restrictions for this procedure.  Exercise Stress Electrocardiogram An exercise stress electrocardiogram is a test that is done to evaluate the blood supply to your heart. This test may also be called exercise stress electrocardiography. The test is done while you are walking on a treadmill. The goal of this test is to raise your heart rate. This test is done to find areas of poor blood flow to the heart by determining the extent of coronary artery disease (CAD).   CAD is defined as narrowing in one or more heart (coronary) arteries of more than 70%. If you have an abnormal test result, this may mean that you are not getting adequate blood flow to your heart during exercise. Additional testing may be needed to understand why your test was abnormal. LET [facility] CARE PROVIDER KNOW ABOUT:   Any allergies you have.  All medicines you are taking, including vitamins, herbs, eye drops, creams, and over-the-counter medicines.  Previous problems you or members of your family have had with the use of anesthetics.  Any blood disorders you have.  Previous surgeries you have had.  Medical conditions you have.  Possibility of pregnancy, if this applies. RISKS AND COMPLICATIONS Generally, this is a safe procedure. However, as with any procedure, complications can occur. Possible  complications can include:  Pain or pressure in the following areas:  Chest.  Jaw or neck.  Between your shoulder blades.  Radiating down your left arm.  Dizziness or light-headedness.  Shortness of breath.  Increased or irregular heartbeats.  Nausea or vomiting.  Heart attack (rare). BEFORE THE PROCEDURE  Avoid all forms of caffeine 24 hours before your test or as directed by your health care provider. This includes coffee, tea (even decaffeinated tea), caffeinated sodas, chocolate, cocoa, and certain pain medicines.  Follow your health care provider's instructions regarding eating and drinking before the test.  Take your medicines as directed at regular times with water unless instructed otherwise. Exceptions may include:  If you have diabetes, ask how you are to take your insulin or pills. It is common to adjust insulin dosing the morning of the test.  If you are taking beta-blocker medicines, it is important to talk to your health care provider about these medicines well before the date of your test. Taking beta-blocker medicines may interfere with the test. In some cases, these medicines need to be changed or stopped 24 hours or more before the test.  If you wear a nitroglycerin patch, it may need to be removed prior to the test. Ask your health care provider if the patch should be removed before the test.  If you use an inhaler for any breathing condition, bring it with you to the test.  If you are an outpatient, bring a snack so you can eat right after the stress phase of the test.  Do not smoke for 4 hours prior to the test or as directed by your health care provider.  Do not apply lotions, powders, creams, or oils on your chest prior to the test.  Wear loose-fitting clothes and comfortable shoes for the test. This test involves walking on a treadmill. PROCEDURE  Multiple patches (electrodes) will be put on your chest. If needed, small areas of your chest may  have to be shaved to get better contact with the electrodes. Once the electrodes are attached to your body, multiple wires will be attached to the electrodes and your heart rate will be monitored.  Your heart will be monitored both at rest and while exercising.  You will walk on a treadmill. The treadmill will be started at a slow pace. The treadmill speed and incline will gradually be increased to raise your heart rate. AFTER THE PROCEDURE  Your heart rate and blood pressure will be monitored after the test.  You may return to your normal schedule including diet, activities, and medicines, unless your health care provider tells you otherwise. Document Released: 02/17/2000 Document Revised: 02/24/2013 Document Reviewed: 10/27/2012 [facility] Patient Information 2015 [place], [place]. This information is not intended to replace advice given to you by your health care provider. Make sure you discuss any questions you have with your health care provider.

## 2014-09-03 ENCOUNTER — Encounter: Payer: Self-pay | Admitting: *Deleted

## 2014-09-30 ENCOUNTER — Encounter (HOSPITAL_COMMUNITY): Payer: 59

## 2014-09-30 ENCOUNTER — Other Ambulatory Visit (HOSPITAL_COMMUNITY): Payer: 59

## 2014-10-28 ENCOUNTER — Ambulatory Visit: Payer: 59 | Admitting: Internal Medicine

## 2015-08-11 ENCOUNTER — Other Ambulatory Visit (INDEPENDENT_AMBULATORY_CARE_PROVIDER_SITE_OTHER): Payer: BLUE CROSS/BLUE SHIELD

## 2015-08-11 ENCOUNTER — Ambulatory Visit (INDEPENDENT_AMBULATORY_CARE_PROVIDER_SITE_OTHER)
Admission: RE | Admit: 2015-08-11 | Discharge: 2015-08-11 | Disposition: A | Discharge status: Home / Self Care | Payer: BLUE CROSS/BLUE SHIELD | Source: Ambulatory Visit | Attending: Internal Medicine | Admitting: Internal Medicine

## 2015-08-11 ENCOUNTER — Encounter: Payer: Self-pay | Admitting: Internal Medicine

## 2015-08-11 ENCOUNTER — Ambulatory Visit (INDEPENDENT_AMBULATORY_CARE_PROVIDER_SITE_OTHER): Payer: BLUE CROSS/BLUE SHIELD | Admitting: Internal Medicine

## 2015-08-11 VITALS — BP 146/86 | HR 69 | Ht 59.0 in | Wt 174.0 lb

## 2015-08-11 DIAGNOSIS — R0609 Other forms of dyspnea: Secondary | ICD-10-CM

## 2015-08-11 DIAGNOSIS — G4733 Obstructive sleep apnea (adult) (pediatric): Secondary | ICD-10-CM | POA: Diagnosis not present

## 2015-08-11 DIAGNOSIS — I10 Essential (primary) hypertension: Secondary | ICD-10-CM | POA: Diagnosis not present

## 2015-08-11 LAB — BRAIN NATRIURETIC PEPTIDE: Pro B Natriuretic peptide (BNP): 6 pg/mL (ref 0.0–100.0)

## 2015-08-11 NOTE — Progress Notes (Signed)
Subjective:     Patient ID: Shelly Garza, female   DOB: 1955-07-13,    MRN: [id number]  HPI  73 yobf never smoker, never significant allergies with onset of hbp in 1980s and sob unexplained since late 90's persisted ever since with neg cards eval by Dr Debara Pickett and referred to pulmonary clinic 08/11/2015 by Dr Sheryle Hail for ?osa/daytime hypersomnolence    08/11/2015 1st [facility] office visit/ Mal Asher   Chief Complaint  Patient presents with  . Sleep/Pulmonary Consult    Referred by Dr. Sheryle Hail. Pt c/o daytime sleepiness and snoring. She has not had a PSG. She also c/o SOB "all the time" "for years".   loses breath at rest x years esp if uses voice  Doe =  MMRC1 = can walk nl pace, flat grade, can't hurry or go uphills or steps s sob   Does bicycle or treadmill at gym x 5 days a week  gxt 3 mph up to 3.5 % grade  X 15 min Last took ACEi 08/2014 unclear why stopped but bp has been very difficult to control per pt    Sleep Questionnaire:  What time do you typically go to bed?   10-11   How long does it take you to fall asleep?  10-61m  How many times during the night do you wake up?   3 What time do you get out of bed to start your day?  7a Do you drive or operate heavy machinery in your occupation?   no  How much has your weight changed (up or down) over the past two years?   Plus 5  Have you ever had a sleep study before?   no    If yes, location of study?   n/a   If yes, date of study?   n/a  Do you currently use CPAP?   no If so, what pressure?   n/a   Do you wear oxygen at any time?  no Epworth score   12   No obvious day to day or daytime variability or assoc chronic cough or cp or chest tightness, subjective wheeze or overt sinus or hb symptoms. No unusual exp hx or h/o childhood pna/ asthma or knowledge of premature birth.  Sleeping ok without nocturnal  or early am exacerbation  of respiratory  c/o's or need for noct saba. Also denies any obvious fluctuation of symptoms with  weather or environmental changes or other aggravating or alleviating factors except as outlined above   Current Medications, Allergies, Complete Past Medical History, Past Surgical History, Family History, and Social History were reviewed in [employer] [facility] record.  ROS  The following are not active complaints unless bolded sore throat, dysphagia, dental problems, itching, sneezing,  nasal congestion or excess/ purulent secretions, ear ache,   fever, chills, sweats, unintended wt loss, classically pleuritic or exertional cp, hemoptysis,  orthopnea pnd or leg swelling, presyncope, palpitations, abdominal pain, anorexia, nausea, vomiting, diarrhea  or change in bowel or bladder habits, change in stools or urine, dysuria,hematuria,  rash, arthralgias, visual complaints, headache, numbness, weakness or ataxia or problems with walking or coordination,  change in mood/affect or memory.            Review of Systems     Objective:   Physical Exam    amb pleasant bf nad   Wt Readings from Last 3 Encounters:  08/11/15 174 lb (78.926 kg)  09/02/14 171 lb 14.4 oz (77.973 kg)  Vital signs reviewed/  Note BP elevation   HEENT: nl dentition, turbinates, and oropharynx. Nl external ear canals without cough reflex   NECK :  without JVD/Nodes/TM/ nl carotid upstrokes bilaterally  Modified Mallampati Score = 1     LUNGS: no acc muscle use,  Nl contour chest which is clear to A and P bilaterally without cough on insp or exp maneuvers   CV:  RRR  no s3 or murmur or increase in P2, no edema   ABD:  soft and nontender with nl inspiratory excursion in the supine position. No bruits or organomegaly, bowel sounds nl  MS:  Nl gait/ ext warm without deformities, calf tenderness, cyanosis or clubbing No obvious joint restrictions   SKIN: warm and dry without lesions    NEURO:  alert, approp, nl sensorium with  no motor deficits    CXR PA and Lateral:   08/11/2015 :    I  personally reviewed images and agree with radiology impression as follows:   Mild cardiac enlargement.    Lab Results  Component Value Date   PROBNP 6.0 08/11/2015  Labs reviewed 05/24/15 nl including tsh.hgb, hco3   Assessment:

## 2015-08-11 NOTE — Progress Notes (Signed)
Quick Note:  LMTCB ______ 

## 2015-08-11 NOTE — Patient Instructions (Addendum)
Please see patient coordinator before you leave today  to schedule split night study and we will set you up with a sleep specialist to fine tune the cpap machine if it turns out you need one.   Weight control is simply a matter of calorie balance which needs to be tilted in your favor by eating less and exercising more.  To get the most out of exercise, you need to be continuously aware that you are short of breath, but never out of breath, for 30 minutes daily. As you improve, it will actually be easier for you to do the same amount of exercise  in  30 minutes so always push to the level where you are short of breath.  If this does not result in gradual weight reduction then I strongly recommend you see a nutritionist with a food diary x 2 weeks so that we can work out a negative calorie balance which is universally effective in steady weight loss programs.  Think of your calorie balance like you do your bank account where in this case you want the balance to go down so you must take in less calories than you burn up.  It's just that simple:  Hard to do, but easy to understand.  Good luck!   Please remember to go to the lab and x-ray department downstairs for your tests - we will call you with the results when they are available.

## 2015-08-12 NOTE — Assessment & Plan Note (Addendum)
If bp not controlled better by cpap would   rec consider using minoxidil in low doses as the vasodilator  and simplifying her rx to one neg inotrope/chronotrope in max doses titrated to pulse and diuretic vs  add   Diuretic/arb combo at that point

## 2015-08-12 NOTE — Assessment & Plan Note (Signed)
08/11/2015  Walked RA x 3 laps @ 185 ft each stopped due to  End of study, fast pace, no  desat   Min sob   Suspect this is related to diastolic dysfunction / obesity > could do cpst to sort out if not making progress with wt loss/ bp control

## 2015-08-12 NOTE — Assessment & Plan Note (Signed)
I suspect she does have mild / mod osa clinically and ? Whether or not it's contributing to any of her daytime symptoms but also might be causing bp to be so difficult to control so reasonable to proceed with split night study and start on cpap if qualifies with f/u with one of our sleep docs to fine tune it  Total time devoted to counseling  = 35/45m review case with pt/ discussion of options/alternatives/ personally creating written instructions  in presence of pt  then going over those specific  Instructions directly with the pt including how to use all of the meds but in particular covering each new medication in detail and the difference between the maintenance/automatic meds and the prns using an action plan format for the latter.

## 2015-08-12 NOTE — Assessment & Plan Note (Signed)
Body mass index is 35.13    No results found for: TSH   Contributing to gerd tendency/ doe/reviewed the need and the process to achieve and maintain neg calorie balance > defer f/u primary care including intermittently monitoring thyroid status

## 2015-08-17 NOTE — Progress Notes (Signed)
Quick Note:  LMTCB ______ 

## 2015-08-18 NOTE — Progress Notes (Signed)
Quick Note:  Spoke with pt and notified of results per Dr. Wert. Pt verbalized understanding and denied any questions.  ______ 

## 2015-09-02 ENCOUNTER — Other Ambulatory Visit: Payer: Self-pay | Admitting: Internal Medicine

## 2015-09-02 DIAGNOSIS — Z1231 Encounter for screening mammogram for malignant neoplasm of breast: Secondary | ICD-10-CM

## 2015-09-15 ENCOUNTER — Ambulatory Visit (HOSPITAL_BASED_OUTPATIENT_CLINIC_OR_DEPARTMENT_OTHER): Payer: BLUE CROSS/BLUE SHIELD | Attending: Internal Medicine | Admitting: Pulmonary Disease

## 2015-09-15 VITALS — Ht 59.0 in | Wt 176.0 lb

## 2015-09-15 DIAGNOSIS — G4733 Obstructive sleep apnea (adult) (pediatric): Secondary | ICD-10-CM | POA: Diagnosis present

## 2015-09-15 DIAGNOSIS — I493 Ventricular premature depolarization: Secondary | ICD-10-CM | POA: Insufficient documentation

## 2015-09-15 DIAGNOSIS — R0683 Snoring: Secondary | ICD-10-CM

## 2015-09-21 ENCOUNTER — Telehealth: Payer: Self-pay | Admitting: *Deleted

## 2015-09-21 DIAGNOSIS — R0683 Snoring: Secondary | ICD-10-CM | POA: Insufficient documentation

## 2015-09-21 DIAGNOSIS — G4733 Obstructive sleep apnea (adult) (pediatric): Secondary | ICD-10-CM

## 2015-09-21 NOTE — Procedures (Signed)
Patient Name: Shelly Garza, Bas Date: 09/15/2015 Gender: Female D.O.B: [birth year] Age (years): 53 Referring Provider: Tanda Rockers Height (inches): 45 Interpreting Physician: Chesley Mires MD, ABSM Weight (lbs): 176 RPSGT: Neeriemer, Holly BMI: 36 MRN: [id number] Neck Size: 13.50  CLINICAL INFORMATION Sleep Study Type: NPSG   Indication for sleep study: OSA   Epworth Sleepiness Score: 2.   SLEEP STUDY TECHNIQUE As per the AASM Manual for the Scoring of Sleep and Associated Events v2.3 (April 2016) with a hypopnea requiring 4% desaturations. The channels recorded and monitored were frontal, central and occipital EEG, electrooculogram (EOG), submentalis EMG (chin), nasal and oral airflow, thoracic and abdominal wall motion, anterior tibialis EMG, snore microphone, electrocardiogram, and pulse oximetry.  MEDICATIONS Patient's medications include: reviewed in electronic medical record. Medications self-administered by patient during sleep study : No sleep medicine administered.  SLEEP ARCHITECTURE The study was initiated at 10:06:42 PM and ended at 4:38:38 AM. Sleep onset time was 18.8 minutes and the sleep efficiency was 72.1%. The total sleep time was 282.6 minutes. Stage REM latency was 221.0 minutes. The patient spent 9.20% of the night in stage N1 sleep, 88.50% in stage N2 sleep, 0.00% in stage N3 and 2.30% in REM. Alpha intrusion was absent. Supine sleep was 99.80%.  RESPIRATORY PARAMETERS The overall apnea/hypopnea index (AHI) was 2.1 per hour. There were 0 total apneas, including 0 obstructive, 0 central and 0 mixed apneas. There were 10 hypopneas and 10 RERAs. The AHI during Stage REM sleep was 9.2 per hour. AHI while supine was 2.1 per hour. The mean oxygen saturation was 92.42%. The minimum SpO2 during sleep was 77.00%. Moderate snoring was noted during this study.  CARDIAC DATA The 2 lead EKG demonstrated sinus rhythm. The mean heart rate was 68.77 beats per  minute. Other EKG findings include: PVCs.  LEG MOVEMENT DATA The total PLMS were 1 with a resulting PLMS index of 0.21. Associated arousal with leg movement index was 0.0 .  IMPRESSIONS While she had several obstructive respiratory events, these were not frequent enough to qualify for diagnosis of obstructive sleep apnea.  Her AHI was 2.1 with SpO2 low of 77%.  She spent 3.1 minutes of test time with an SpO2 < 88%.  DIAGNOSIS - Snoring (786.09 [R06.83 ICD-10]).  RECOMMENDATIONS - Avoid alcohol, sedatives and other CNS depressants that may worsen sleep apnea and disrupt normal sleep architecture. - Sleep hygiene should be reviewed to assess factors that may improve sleep quality. - Weight management and regular exercise should be initiated or continued if appropriate.   [Electronically signed] 09/21/2015 08:43 AM  Chesley Mires MD, ABSM Diplomate, American Board of Sleep Medicine   NPI: [id number]

## 2015-09-21 NOTE — Telephone Encounter (Signed)
Per MW- PSG does not show significant OSA, needs ov to discuss  Spoke with pt and notified of results per Dr. Melvyn Novas. Pt verbalized understanding and denied any questions. appt scheduled for 09/26/15

## 2015-09-26 ENCOUNTER — Ambulatory Visit (INDEPENDENT_AMBULATORY_CARE_PROVIDER_SITE_OTHER): Payer: BLUE CROSS/BLUE SHIELD | Admitting: Internal Medicine

## 2015-09-26 ENCOUNTER — Encounter: Payer: Self-pay | Admitting: Internal Medicine

## 2015-09-26 VITALS — BP 140/84 | HR 78 | Ht 59.0 in | Wt 171.0 lb

## 2015-09-26 DIAGNOSIS — R0609 Other forms of dyspnea: Secondary | ICD-10-CM

## 2015-09-26 DIAGNOSIS — I10 Essential (primary) hypertension: Secondary | ICD-10-CM | POA: Diagnosis not present

## 2015-09-26 DIAGNOSIS — G4733 Obstructive sleep apnea (adult) (pediatric): Secondary | ICD-10-CM

## 2015-09-26 NOTE — Progress Notes (Signed)
Subjective:     Patient ID: Shelly Garza, female   DOB: 10/22/1955,    MRN: [id number]    Brief patient profile:  31 yobf never smoker, never significant allergies with onset of hbp in 1980s and sob unexplained since late 90's persisted ever since with neg cards eval by Dr Debara Pickett and referred to pulmonary clinic 08/11/2015 by Dr Sheryle Hail for ?osa/daytime hypersomnolence    08/11/2015 1st [facility] office visit/ Shelly Garza   Chief Complaint  Patient presents with  . Sleep/Pulmonary Consult    Referred by Dr. Sheryle Hail. Pt c/o daytime sleepiness and snoring. She has not had a PSG. She also c/o SOB "all the time" "for years".   loses breath at rest x years esp if uses voice  Doe =  MMRC1 = can walk nl pace, flat grade, can't hurry or go uphills or steps s sob   Does bicycle or treadmill at gym x 5 days a week  gxt 3 mph up to 3.5 % grade  X 15 min Last took ACEi 08/2014 unclear why stopped but bp has been very difficult to control per pt    Sleep Questionnaire:  What time do you typically go to bed?   10-11   How long does it take you to fall asleep?  10-71m  How many times during the night do you wake up?   3 What time do you get out of bed to start your day?  7a Do you drive or operate heavy machinery in your occupation?   no  How much has your weight changed (up or down) over the past two years?   Plus 5  Have you ever had a sleep study before?   no    If yes, location of study?   n/a   If yes, date of study?   n/a  Do you currently use CPAP?   no If so, what pressure?   n/a   Do you wear oxygen at any time?  no Epworth score   12 Split night study 09/15/15:  While she had several obstructive respiratory events, these were not frequent enough to qualify for diagnosis of obstructive sleep apnea. Her AHI was 2.1 with SpO2 low of 77%. She spent 3.1 minutes of test time with an SpO2 < 88%.     09/26/2015  f/u ov/Shelly Garza re: unexplained doe/ ?osa  Chief Complaint  Patient presents with  .  Follow-up    Breathing is unchanged. She is here to review PSG. No new co's today.      still able to work out ok esp on cycle ergometer  but can't get in a hurry or go up steps s sob   No obvious day to day or daytime variability or assoc chronic cough or cp or chest tightness, subjective wheeze or overt sinus or hb symptoms. No unusual exp hx or h/o childhood pna/ asthma or knowledge of premature birth.  Sleeping ok without nocturnal  or early am exacerbation  of respiratory  c/o's or need for noct saba. Also denies any obvious fluctuation of symptoms with weather or environmental changes or other aggravating or alleviating factors except as outlined above   Current Medications, Allergies, Complete Past Medical History, Past Surgical History, Family History, and Social History were reviewed in [employer] [facility] record.  ROS  The following are not active complaints unless bolded sore throat, dysphagia, dental problems, itching, sneezing,  nasal congestion or excess/ purulent secretions, ear ache,   fever,  chills, sweats, unintended wt loss, classically pleuritic or exertional cp, hemoptysis,  orthopnea pnd or leg swelling, presyncope, palpitations, abdominal pain, anorexia, nausea, vomiting, diarrhea  or change in bowel or bladder habits, change in stools or urine, dysuria,hematuria,  rash, arthralgias, visual complaints, headache, numbness, weakness or ataxia or problems with walking or coordination,  change in mood/affect or memory.           Objective:   Physical Exam    amb pleasant bf nad    09/26/2015       171   08/11/15 174 lb (78.926 kg)  09/02/14 171 lb 14.4 oz (77.973 kg)    Vital signs reviewed  - note bp ok    HEENT: nl dentition, turbinates, and oropharynx. Nl external ear canals without cough reflex   NECK :  without JVD/Nodes/TM/ nl carotid upstrokes bilaterally  Modified Mallampati Score = 1     LUNGS: no acc muscle use,  Nl contour chest which  is clear to A and P bilaterally without cough on insp or exp maneuvers   CV:  RRR  no s3 or murmur or increase in P2, no edema   ABD:  soft and nontender with nl inspiratory excursion in the supine position. No bruits or organomegaly, bowel sounds nl  MS:  Nl gait/ ext warm without deformities, calf tenderness, cyanosis or clubbing No obvious joint restrictions   SKIN: warm and dry without lesions    NEURO:  alert, approp, nl sensorium with  no motor deficits     I personally reviewed images and agree with radiology impression as follows:  CXR:  08/11/15 Mild cardiac enlargement.   BNP  08/11/15 =  6.0       Assessment:

## 2015-09-26 NOTE — Patient Instructions (Addendum)
Weight control is simply a matter of calorie balance which needs to be tilted in your favor by eating less and exercising more.  To get the most out of exercise, you need to be continuously aware that you are short of breath, but never out of breath, for 30 minutes daily. As you improve, it will actually be easier for you to do the same amount of exercise  in  30 minutes so always push to the level where you are short of breath.  If this does not result in gradual weight reduction then I strongly recommend you see a nutritionist with a food diary x 2 weeks so that we can work out a negative calorie balance which is universally effective in steady weight loss programs.  Think of your calorie balance like you do your bank account where in this case you want the balance to go down so you must take in less calories than you burn up.  It's just that simple:  Hard to do, but easy to understand.  Good luck!   Pulmonary follow up is as needed with CPST next if not happy with improvement over time

## 2015-09-28 NOTE — Assessment & Plan Note (Signed)
Ok on present rx - note the sleep study failed to suggest osa as contributing so the best rx is wt loss here, not additional meds or pulmonary/sleep f/u

## 2015-09-28 NOTE — Assessment & Plan Note (Signed)
Split night study 09/15/15:  While she had several obstructive respiratory events, these were not frequent enough to qualify for diagnosis of obstructive sleep apnea. Her AHI was 2.1 with SpO2 low of 77%. She spent 3.1 minutes of test time with an SpO2 < 88%.   Study reviewed, main rx is wt loss

## 2015-09-28 NOTE — Assessment & Plan Note (Signed)
08/11/2015  Walked RA x 3 laps @ 185 ft each stopped due to  End of study, fast pace, no  desat   Min sob   Since not able to reproduce in office or at the gym strongly suspect this is just do to obesity where she does really well when she is not wt bearing eg cycle ergometry but would need cpst to sort out   I had an extended final summary discussion with the patient reviewing all relevant studies completed to date and  lasting 15 to 20 minutes of a 25 minute visit on the following issues:    Discussed in detail all the  indications, usual  risks and alternatives  relative to the benefits with patient who agrees to proceed with conservative f/u as outlined  With cpst if not satisfied but no additional rx

## 2016-07-06 ENCOUNTER — Ambulatory Visit (INDEPENDENT_AMBULATORY_CARE_PROVIDER_SITE_OTHER): Payer: BLUE CROSS/BLUE SHIELD

## 2016-07-06 ENCOUNTER — Ambulatory Visit (INDEPENDENT_AMBULATORY_CARE_PROVIDER_SITE_OTHER): Payer: BLUE CROSS/BLUE SHIELD | Admitting: Physician Assistant

## 2016-07-06 DIAGNOSIS — G8929 Other chronic pain: Secondary | ICD-10-CM

## 2016-07-06 DIAGNOSIS — M25561 Pain in right knee: Secondary | ICD-10-CM | POA: Diagnosis not present

## 2016-07-06 DIAGNOSIS — M7062 Trochanteric bursitis, left hip: Secondary | ICD-10-CM

## 2016-07-06 DIAGNOSIS — M25562 Pain in left knee: Secondary | ICD-10-CM

## 2016-07-06 DIAGNOSIS — M7061 Trochanteric bursitis, right hip: Secondary | ICD-10-CM | POA: Diagnosis not present

## 2016-07-06 MED ORDER — LIDOCAINE HCL 1 % IJ SOLN
3.0000 mL | INTRAMUSCULAR | Status: AC | PRN
  Administered 2016-07-06: 3 mL

## 2016-07-06 MED ORDER — METHYLPREDNISOLONE ACETATE 40 MG/ML IJ SUSP
40.0000 mg | INTRAMUSCULAR | Status: AC | PRN
  Administered 2016-07-06: 40 mg via INTRA_ARTICULAR

## 2016-07-06 NOTE — Progress Notes (Signed)
Office Visit Note   Patient: Shelly Garza           Date of Birth: [birth year]           MRN: [id number] Visit Date: 07/06/2016              Requested by: Antonietta Jewel, MD [address], [place], [place] [address] PCP: Antonietta Jewel, MD   Assessment & Plan: Visit Diagnoses:  1. Chronic pain of left knee   2. Chronic pain of right knee   3. Trochanteric bursitis of both hips     Plan: We will have her work on quad strengthening and IT band stretching exercises as shown in the office today. She is to monitor her glucose levels status post the cortisone injection left hip today.  Follow-Up Instructions: Return in about 4 weeks (around 08/03/2016).   Orders:  Orders Placed This Encounter  Procedures  . Large Joint Injection/Arthrocentesis  . XR HIP UNILAT W OR W/O PELVIS 1V LEFT  . XR Knee 1-2 Views Left  . XR Knee 1-2 Views Right   No orders of the defined types were placed in this encounter.     Procedures: Large Joint Inj Date/Time: 07/06/2016 12:16 PM Performed by: Pete Pelt Authorized by: Pete Pelt   Consent Given by:  Patient Indications:  Pain Location:  Hip Site:  L greater trochanter Needle Size:  22 G Needle Length:  1.5 inches Approach:  Lateral Ultrasound Guidance: No   Fluoroscopic Guidance: No   Arthrogram: No   Medications:  40 mg methylPREDNISolone acetate 40 MG/ML; 3 mL lidocaine 1 % Aspiration Attempted: No   Patient tolerance:  Patient tolerated the procedure well with no immediate complications     Clinical Data: No additional findings.   Subjective: Bilateral knee pain Left hip pain  HPI Shelly Garza's well-known Dr. Trevor Garza service comes in today with a left hip pain states it is like what she had years ago and had a cortisone injection. She denies any groin pain no radicular symptoms down the leg. She's had no injury to the left hip. She's having bilateral knee pain is had cortisone injections in the recent past  and this helped. She's had no new injury to either knee. She's having no mechanical symptoms of either knee. She does have some difficulty with steps. She is a diabetic worse last hemoglobin A1c was 5.6. Review of Systems See history of present illness  Objective: Vital Signs: There were no vitals taken for this visit.  Physical Exam  Constitutional: She is oriented to person, place, and time. She appears well-developed and well-nourished. No distress.  Neurological: She is alert and oriented to person, place, and time.  Skin: She is not diaphoretic.  Psychiatric: She has a normal mood and affect.    Ortho Exam Bilateral hip she has excellent range of motion of both hips without pain. She has tenderness over the trochanteric region of both hips left greater than right. Bilateral knees she has full extension flexion no abnormal warmth erythema. Tenderness along medial lateral joint line of both knees and along the peripatellar region both knees. Negative McMurray's bilaterally. Positive Phillips Odor test bilaterally Specialty Comments:  No specialty comments available.  Imaging: Xr Hip Unilat W Or W/o Pelvis 1v Left  Result Date: 07/06/2016 AP pelvis and lateral view of left hip: No acute fracture. Bilateral hips are well located and hip joints are well preserved.  Xr Knee 1-2 Views Left  Result  Date: 07/06/2016 Left knee AP lateral views: No acute fracture. Medial lateral joint lines well-preserved. Mild patellar femoral changes.  Xr Knee 1-2 Views Right  Result Date: 07/06/2016 Right knee AP lateral: No acute fractures. Medial lateral joint lines well-preserved. Mild patellofemoral changes. No other bony abnormalities.    PMFS History: Patient Active Problem List   Diagnosis Date Noted  . Snoring 09/21/2015  . Morbid obesity due to excess calories ([place]) 08/12/2015  . OSA (obstructive sleep apnea) 08/11/2015  . Essential hypertension 08/11/2015  . Murmur 09/02/2014  . DOE  (dyspnea on exertion) 09/02/2014   Past Medical History:  Diagnosis Date  . Diabetes mellitus without complication ([facility])   . Hypertension   . Sleep apnea     Family History  Problem Relation Age of Onset  . Breast cancer Mother   . Hypertension Father   . Breast cancer Sister     Past Surgical History:  Procedure Laterality Date  . BREAST LUMPECTOMY  early 27s  . CESAREAN SECTION     x2   Social History   Occupational History  . inspector/packer [employer]   Social History Main Topics  . Smoking status: Never Smoker  . Smokeless tobacco: Never Used  . Alcohol use 0.0 oz/week     Comment: red wine (once a month)  . Drug use: No  . Sexual activity: Not on file

## 2016-08-06 ENCOUNTER — Ambulatory Visit (INDEPENDENT_AMBULATORY_CARE_PROVIDER_SITE_OTHER): Payer: BLUE CROSS/BLUE SHIELD | Admitting: Physician Assistant

## 2017-03-11 ENCOUNTER — Ambulatory Visit (INDEPENDENT_AMBULATORY_CARE_PROVIDER_SITE_OTHER): Payer: BLUE CROSS/BLUE SHIELD | Admitting: Physician Assistant

## 2017-03-11 ENCOUNTER — Encounter (INDEPENDENT_AMBULATORY_CARE_PROVIDER_SITE_OTHER): Payer: Self-pay | Admitting: Physician Assistant

## 2017-03-11 VITALS — Ht 59.0 in | Wt 177.0 lb

## 2017-03-11 DIAGNOSIS — G8929 Other chronic pain: Secondary | ICD-10-CM

## 2017-03-11 DIAGNOSIS — M25561 Pain in right knee: Secondary | ICD-10-CM

## 2017-03-11 MED ORDER — LIDOCAINE HCL 1 % IJ SOLN
3.0000 mL | INTRAMUSCULAR | Status: AC | PRN
  Administered 2017-03-11: 3 mL

## 2017-03-11 MED ORDER — METHYLPREDNISOLONE ACETATE 40 MG/ML IJ SUSP
40.0000 mg | INTRAMUSCULAR | Status: AC | PRN
  Administered 2017-03-11: 40 mg via INTRA_ARTICULAR

## 2017-03-11 NOTE — Progress Notes (Signed)
Office Visit Note   Patient: Shelly Garza           Date of Birth: 1955-12-25           MRN: [id number] Visit Date: 03/11/2017              Requested by: Shelly Jewel, MD [address] [place], [place] [address] PCP: Shelly Jewel, MD   Assessment & Plan: Visit Diagnoses:  1. Chronic pain of right knee     Plan: She will continue to do quad strengthening bilateral knees.  She will pick up some tumor begin taking this and see if this helps have discussed with her it may take up to 3 weeks for her to see a difference.  Also she will pick up an open patella brace and see if this helps with her knee pain.  We will see her back in 1 month check her progress lack of.  Follow-Up Instructions: Return in about 4 weeks (around 04/08/2017).   Orders:  Orders Placed This Encounter  Procedures  . Large Joint Inj   No orders of the defined types were placed in this encounter.     Procedures: Large Joint Inj: R knee on 03/11/2017 11:22 AM Indications: pain Details: 22 G 1.5 in needle, anterolateral approach  Arthrogram: No  Medications: 3 mL lidocaine 1 %; 40 mg methylPREDNISolone acetate 40 MG/ML Outcome: tolerated well, no immediate complications Procedure, treatment alternatives, risks and benefits explained, specific risks discussed. Consent was given by the patient. Immediately prior to procedure a time out was called to verify the correct patient, procedure, equipment, support staff and site/side marked as required. Patient was prepped and draped in the usual sterile fashion.       Clinical Data: No additional findings.   Subjective: Chief Complaint  Patient presents with  . Right Knee - Pain    HPI Shelly Garza returns today complaining of right knee pain.  She was last seen in May that time had a trochanteric bursitis which was helped with an injection.  She also is having some knee pain.  She was to begin doing some quad strengthening which she has done.   States mainly she is having right knee pain now which is been worse since September.  She states it feels that the bones are rubbing together.  Some swelling.  She has some giving way sensation no locking catching or painful popping.  She has pain with prolonged sitting and also with stairs.  No new injury to either knee. Review of Systems Please see HPI otherwise negative  Objective: Vital Signs: Ht 4\' 11"  (1.499 m)   Wt 177 lb (80.3 kg)   BMI 35.75 kg/m   Physical Exam  Constitutional: She is oriented to person, place, and time. She appears well-developed and well-nourished. No distress.  Pulmonary/Chest: Effort normal.  Neurological: She is alert and oriented to person, place, and time.  Skin: She is not diaphoretic.  Psychiatric: She has a normal mood and affect.    Ortho Exam Bilateral knees she has good range of motion.  Passive range of motion of both knees reveals right greater than left patellofemoral crepitus.  Tenderness peripatellar region of the right knee, tenderness along medial lateral joint line of the right knee.  Slight tenderness medial lateral joint line of the left knee and some slight peripatellar tenderness left knee.  Shelly Garza is positive on the right negative on the left.  Negative McMurray's bilaterally.  No instability  valgus varus stressing of either knee. Specialty Comments:  No specialty comments available.  Imaging: No results found.   PMFS History: Patient Active Problem List   Diagnosis Date Noted  . Snoring 09/21/2015  . Morbid obesity due to excess calories ([place]) 08/12/2015  . OSA (obstructive sleep apnea) 08/11/2015  . Essential hypertension 08/11/2015  . Murmur 09/02/2014  . DOE (dyspnea on exertion) 09/02/2014   Past Medical History:  Diagnosis Date  . Diabetes mellitus without complication ([facility])   . Hypertension   . Sleep apnea     Family History  Problem Relation Age of Onset  . Breast cancer Mother   . Hypertension Father     . Breast cancer Sister     Past Surgical History:  Procedure Laterality Date  . BREAST LUMPECTOMY  early 55s  . CESAREAN SECTION     x2   Social History   Occupational History  . Occupation: Architectural technologist: [employer]  Tobacco Use  . Smoking status: Never Smoker  . Smokeless tobacco: Never Used  Substance and Sexual Activity  . Alcohol use: Yes    Alcohol/week: 0.0 oz    Comment: red wine (once a month)  . Drug use: No  . Sexual activity: Not on file

## 2017-04-08 ENCOUNTER — Ambulatory Visit (INDEPENDENT_AMBULATORY_CARE_PROVIDER_SITE_OTHER): Payer: BLUE CROSS/BLUE SHIELD | Admitting: Physician Assistant

## 2017-04-08 ENCOUNTER — Encounter (INDEPENDENT_AMBULATORY_CARE_PROVIDER_SITE_OTHER): Payer: Self-pay | Admitting: Physician Assistant

## 2017-04-08 ENCOUNTER — Ambulatory Visit (INDEPENDENT_AMBULATORY_CARE_PROVIDER_SITE_OTHER): Payer: BLUE CROSS/BLUE SHIELD

## 2017-04-08 VITALS — Ht 59.0 in | Wt 177.0 lb

## 2017-04-08 DIAGNOSIS — G8929 Other chronic pain: Secondary | ICD-10-CM

## 2017-04-08 DIAGNOSIS — M25561 Pain in right knee: Secondary | ICD-10-CM

## 2017-04-08 NOTE — Progress Notes (Signed)
Office Visit Note   Patient: Shelly Garza           Date of Birth: Sep 17, 1955           MRN: [id number] Visit Date: 04/08/2017              Requested by: Antonietta Jewel, MD [address] [place], [place] [address] PCP: Antonietta Jewel, MD   Assessment & Plan: Visit Diagnoses:  1. Chronic pain of right knee     Plan: We will obtain an MRI of her right knee to rule out meniscal tear.  She is failed conservative treatment which is included cortisone injection, time ,anti-inflammatories and quad strengthening exercises.  She will follow-up after the MRI to go over results and discuss further treatment.  Follow-Up Instructions: Return for AFTER MRI.   Orders:  Orders Placed This Encounter  Procedures  . XR Knee 1-2 Views Right  . MR Knee Right w/o contrast   No orders of the defined types were placed in this encounter.     Procedures: No procedures performed   Clinical Data: No additional findings.   Subjective: Chief Complaint  Patient presents with  . Right Knee - Pain    HPI Mrs. Strand he states overall the knee is doing well today.  She states that that it really was hurting her prior to today.  She continues to have and locking catching sensation in the knee.No givng way. No relief from the injection.  Review of Systems   Objective: Vital Signs: Ht 4\' 11"  (1.499 m)   Wt 177 lb (80.3 kg)   BMI 35.75 kg/m   Physical Exam  Constitutional: She is oriented to person, place, and time. She appears well-developed and well-nourished. No distress.  Pulmonary/Chest: Effort normal.  Neurological: She is alert and oriented to person, place, and time.  Skin: She is not diaphoretic.  Psychiatric: She has a normal mood and affect.    Ortho Exam Bilateral knees full range of motion without pain.  Tenderness along medial joint line of the right knee.  No instability of with valgus varus stressing of either knee.  McMurray's is negative.  No effusion abnormal  warmth erythema of either knee. Specialty Comments:  No specialty comments available.  Imaging: Xr Knee 1-2 Views Right  Result Date: 04/08/2017 Right knee 2 views: No acute fracture.  Slight narrowing the medial joint line otherwise knee is well preserved.  No dislocation subluxation.    PMFS History: Patient Active Problem List   Diagnosis Date Noted  . Snoring 09/21/2015  . Morbid obesity due to excess calories ([facility]) 08/12/2015  . OSA (obstructive sleep apnea) 08/11/2015  . Essential hypertension 08/11/2015  . Murmur 09/02/2014  . DOE (dyspnea on exertion) 09/02/2014   Past Medical History:  Diagnosis Date  . Diabetes mellitus without complication ([facility])   . Hypertension   . Sleep apnea     Family History  Problem Relation Age of Onset  . Breast cancer Mother   . Hypertension Father   . Breast cancer Sister     Past Surgical History:  Procedure Laterality Date  . BREAST LUMPECTOMY  early 41s  . CESAREAN SECTION     x2   Social History   Occupational History  . Occupation: Architectural technologist: [employer]  Tobacco Use  . Smoking status: Never Smoker  . Smokeless tobacco: Never Used  Substance and Sexual Activity  . Alcohol use: Yes    Alcohol/week:  0.0 oz    Comment: red wine (once a month)  . Drug use: No  . Sexual activity: Not on file

## 2017-05-03 ENCOUNTER — Other Ambulatory Visit: Payer: Self-pay

## 2017-06-26 ENCOUNTER — Other Ambulatory Visit: Payer: Self-pay

## 2017-06-26 ENCOUNTER — Ambulatory Visit (INDEPENDENT_AMBULATORY_CARE_PROVIDER_SITE_OTHER): Payer: Self-pay | Admitting: Physician Assistant

## 2017-06-26 ENCOUNTER — Encounter (INDEPENDENT_AMBULATORY_CARE_PROVIDER_SITE_OTHER): Payer: Self-pay | Admitting: Physician Assistant

## 2017-06-26 VITALS — BP 165/94 | HR 69 | Temp 97.8°F | Ht 59.0 in | Wt 180.2 lb

## 2017-06-26 DIAGNOSIS — I10 Essential (primary) hypertension: Secondary | ICD-10-CM

## 2017-06-26 DIAGNOSIS — G5600 Carpal tunnel syndrome, unspecified upper limb: Secondary | ICD-10-CM | POA: Insufficient documentation

## 2017-06-26 DIAGNOSIS — E119 Type 2 diabetes mellitus without complications: Secondary | ICD-10-CM

## 2017-06-26 DIAGNOSIS — F411 Generalized anxiety disorder: Secondary | ICD-10-CM

## 2017-06-26 LAB — POCT GLYCOSYLATED HEMOGLOBIN (HGB A1C): Hemoglobin A1C: 5.9

## 2017-06-26 MED ORDER — PAROXETINE HCL 40 MG PO TABS: 40.0000 mg | ORAL_TABLET | ORAL | 11 refills | Status: DC

## 2017-06-26 MED ORDER — DILTIAZEM HCL ER BEADS 360 MG PO CP24: 360.0000 mg | ORAL_CAPSULE | Freq: Every day | ORAL | 11 refills | Status: AC

## 2017-06-26 MED ORDER — HYDROCHLOROTHIAZIDE 25 MG PO TABS: 25.0000 mg | ORAL_TABLET | Freq: Every day | ORAL | 11 refills | Status: DC

## 2017-06-26 MED ORDER — LOSARTAN POTASSIUM 100 MG PO TABS: 100.0000 mg | ORAL_TABLET | Freq: Every day | ORAL | 11 refills | Status: DC

## 2017-06-26 MED ORDER — ASPIRIN 81 MG PO TABS: 81.0000 mg | ORAL_TABLET | Freq: Every day | ORAL | 11 refills | Status: AC

## 2017-06-26 MED FILL — LOSARTAN POTASSIUM 100 MG T: 100 | 30 days supply | Qty: 30 | Fill #0

## 2017-06-26 MED FILL — HYDROCHLOROTHIAZIDE 25 MG T: 25 | 30 days supply | Qty: 30 | Fill #0

## 2017-06-26 MED FILL — DILTIAZEM HCL ER 360 MG CAP: 360 | 30 days supply | Qty: 30 | Fill #0

## 2017-06-26 MED FILL — PARoxetine HCL 40 MG TABS: 40 | 30 days supply | Qty: 30 | Fill #0

## 2017-06-26 NOTE — Patient Instructions (Signed)
Managing Your Hypertension Hypertension is commonly called high blood pressure. This is when the force of your blood pressing against the walls of your arteries is too strong. Arteries are blood vessels that carry blood from your heart throughout your body. Hypertension forces the heart to work harder to pump blood, and may cause the arteries to become narrow or stiff. Having untreated or uncontrolled hypertension can cause heart attack, stroke, kidney disease, and other problems. What are blood pressure readings? A blood pressure reading consists of a higher number over a lower number. Ideally, your blood pressure should be below 120/80. The first ("top") number is called the systolic pressure. It is a measure of the pressure in your arteries as your heart beats. The second ("bottom") number is called the diastolic pressure. It is a measure of the pressure in your arteries as the heart relaxes. What does my blood pressure reading mean? Blood pressure is classified into four stages. Based on your blood pressure reading, your health care provider may use the following stages to determine what type of treatment you need, if any. Systolic pressure and diastolic pressure are measured in a unit called mm Hg. Normal  Systolic pressure: below 120.  Diastolic pressure: below 80. Elevated  Systolic pressure: 120-129.  Diastolic pressure: below 80. Hypertension stage 1  Systolic pressure: 130-139.  Diastolic pressure: 80-89. Hypertension stage 2  Systolic pressure: 140 or above.  Diastolic pressure: 90 or above. What health risks are associated with hypertension? Managing your hypertension is an important responsibility. Uncontrolled hypertension can lead to:  A heart attack.  A stroke.  A weakened blood vessel (aneurysm).  Heart failure.  Kidney damage.  Eye damage.  Metabolic syndrome.  Memory and concentration problems.  What changes can I make to manage my  hypertension? Hypertension can be managed by making lifestyle changes and possibly by taking medicines. Your health care provider will help you make a plan to bring your blood pressure within a normal range. Eating and drinking  Eat a diet that is high in fiber and potassium, and low in salt (sodium), added sugar, and fat. An example eating plan is called the DASH (Dietary Approaches to Stop Hypertension) diet. To eat this way: ? Eat plenty of fresh fruits and vegetables. Try to fill half of your plate at each meal with fruits and vegetables. ? Eat whole grains, such as whole wheat pasta, brown rice, or whole grain bread. Fill about one quarter of your plate with whole grains. ? Eat low-fat diary products. ? Avoid fatty cuts of meat, processed or cured meats, and poultry with skin. Fill about one quarter of your plate with lean proteins such as fish, chicken without skin, beans, eggs, and tofu. ? Avoid premade and processed foods. These tend to be higher in sodium, added sugar, and fat.  Reduce your daily sodium intake. Most people with hypertension should eat less than 1,500 mg of sodium a day.  Limit alcohol intake to no more than 1 drink a day for nonpregnant women and 2 drinks a day for men. One drink equals 12 oz of beer, 5 oz of wine, or 1 oz of hard liquor. Lifestyle  Work with your health care provider to maintain a healthy body weight, or to lose weight. Ask what an ideal weight is for you.  Get at least 30 minutes of exercise that causes your heart to beat faster (aerobic exercise) most days of the week. Activities may include walking, swimming, or biking.  Include exercise   to strengthen your muscles (resistance exercise), such as weight lifting, as part of your weekly exercise routine. Try to do these types of exercises for 30 minutes at least 3 days a week.  Do not use any products that contain nicotine or tobacco, such as cigarettes and e-cigarettes. If you need help quitting, ask  your health care provider.  Control any long-term (chronic) conditions you have, such as high cholesterol or diabetes. Monitoring  Monitor your blood pressure at home as told by your health care provider. Your personal target blood pressure may vary depending on your medical conditions, your age, and other factors.  Have your blood pressure checked regularly, as often as told by your health care provider. Working with your health care provider  Review all the medicines you take with your health care provider because there may be side effects or interactions.  Talk with your health care provider about your diet, exercise habits, and other lifestyle factors that may be contributing to hypertension.  Visit your health care provider regularly. Your health care provider can help you create and adjust your plan for managing hypertension. Will I need medicine to control my blood pressure? Your health care provider may prescribe medicine if lifestyle changes are not enough to get your blood pressure under control, and if:  Your systolic blood pressure is 130 or higher.  Your diastolic blood pressure is 80 or higher.  Take medicines only as told by your health care provider. Follow the directions carefully. Blood pressure medicines must be taken as prescribed. The medicine does not work as well when you skip doses. Skipping doses also puts you at risk for problems. Contact a health care provider if:  You think you are having a reaction to medicines you have taken.  You have repeated (recurrent) headaches.  You feel dizzy.  You have swelling in your ankles.  You have trouble with your vision. Get help right away if:  You develop a severe headache or confusion.  You have unusual weakness or numbness, or you feel faint.  You have severe pain in your chest or abdomen.  You vomit repeatedly.  You have trouble breathing. Summary  Hypertension is when the force of blood pumping through  your arteries is too strong. If this condition is not controlled, it may put you at risk for serious complications.  Your personal target blood pressure may vary depending on your medical conditions, your age, and other factors. For most people, a normal blood pressure is less than 120/80.  Hypertension is managed by lifestyle changes, medicines, or both. Lifestyle changes include weight loss, eating a healthy, low-sodium diet, exercising more, and limiting alcohol. This information is not intended to replace advice given to you by your health care provider. Make sure you discuss any questions you have with your health care provider. Document Released: 11/14/2011 Document Revised: 01/18/2016 Document Reviewed: 01/18/2016 Elsevier Interactive Patient Education  2018 Else[employer] Inc.  

## 2017-06-26 NOTE — Progress Notes (Signed)
Subjective:  Patient ID: Shelly Garza, female    DOB: 03/29/1955  Age: 62 y.o. MRN: [id number]  CC: HTN  HPI Shelly Garza is a 62 y.o. female with a medical history of DM2, HTN, OSA, dyspnea on exertion, CTS, and chronic left knee pain presents as a new patient for management of HTN. Has run out of medications since approximately four days ago. Does not endorse CP, palpitations, SOB, HA, abdominal pain, LE edema, f/c/n/v, rash, or GI/GU sxs. Endorses occasional anxiety. Taking Paroxetine 10 mg which is only mildly effective for her anxiety and largely effective for her hot flashes.        Outpatient Medications Prior to Visit  Medication Sig Dispense Refill  . allopurinol (ZYLOPRIM) 100 MG tablet Take 100 mg by mouth daily.  5  . aspirin 81 MG tablet Take 81 mg by mouth daily.    Marland Kitchen atorvastatin (LIPITOR) 20 MG tablet TAKE 1 TABLET BY MOUTH EVERYDAY AT BEDTIME  5  . diltiazem (TIAZAC) 360 MG 24 hr capsule Take 360 mg by mouth daily.  11  . hydrochlorothiazide (HYDRODIURIL) 25 MG tablet Take 1 tablet by mouth daily.    Marland Kitchen losartan (COZAAR) 100 MG tablet Take 100 mg by mouth daily.  5  . metFORMIN (GLUCOPHAGE) 500 MG tablet Take 500 mg by mouth 2 (two) times daily with a meal.    . PARoxetine (PAXIL) 10 MG tablet Take 10 mg by mouth daily.    Marland Kitchen amitriptyline (ELAVIL) 75 MG tablet Take 75 mg by mouth at bedtime.  0  . atenolol (TENORMIN) 50 MG tablet Take 50 mg by mouth daily.    . cyclobenzaprine (FLEXERIL) 5 MG tablet Take 5 mg by mouth 3 (three) times daily as needed for muscle spasms.    Marland Kitchen DILT-XR 180 MG 24 hr capsule Take 1 capsule by mouth daily.    . hydrALAZINE (APRESOLINE) 25 MG tablet Take 25 mg by mouth 2 (two) times daily.    Marland Kitchen lovastatin (MEVACOR) 20 MG tablet Take 20 mg by mouth daily.    Marland Kitchen spironolactone (ALDACTONE) 50 MG tablet Take 50 mg by mouth 2 (two) times daily.    Marland Kitchen tiZANidine (ZANAFLEX) 2 MG tablet Take 2 mg by mouth 2 (two) times daily as needed.  2  .  traMADol (ULTRAM) 50 MG tablet Take 50 mg by mouth 2 (two) times daily as needed.  0   No facility-administered medications prior to visit.      ROS Review of Systems  Constitutional: Negative for chills, fever and malaise/fatigue.  Eyes: Negative for blurred vision.  Respiratory: Negative for shortness of breath.   Cardiovascular: Negative for chest pain and palpitations.  Gastrointestinal: Negative for abdominal pain and nausea.  Genitourinary: Negative for dysuria and hematuria.  Musculoskeletal: Negative for joint pain and myalgias.  Skin: Negative for rash.  Neurological: Negative for tingling and headaches.  Psychiatric/Behavioral: Negative for depression. The patient is nervous/anxious.     Objective:  BP (!) 165/94 (BP Location: Left Arm, Patient Position: Sitting, Cuff Size: Large)   Pulse 69   Temp 97.8 F (36.6 C) (Oral)   Ht 4\' 11"  (1.499 m)   Wt 180 lb 3.2 oz (81.7 kg)   SpO2 97%   BMI 36.40 kg/m   BP/Weight 06/26/2017 08/06/8754 06/05/3293  Systolic BP 188 - -  Diastolic BP 94 - -  Wt. (Lbs) 180.2 177 177  BMI 36.4 35.75 35.75      Physical Exam  Constitutional:  She is oriented to person, place, and time.  Well developed, overweight, NAD, polite  HENT:  Head: Normocephalic and atraumatic.  Eyes: No scleral icterus.  Neck: Normal range of motion. Neck supple. No thyromegaly present.  Cardiovascular: Normal rate, regular rhythm and normal heart sounds.  Pulmonary/Chest: Effort normal and breath sounds normal.  Musculoskeletal: She exhibits no edema.  Neurological: She is alert and oriented to person, place, and time.  Skin: Skin is warm and dry. No rash noted. No erythema. No pallor.  Psychiatric: She has a normal mood and affect. Her behavior is normal. Thought content normal.  Vitals reviewed.    Assessment & Plan:    1. Hypertension, unspecified type - Refill losartan (COZAAR) 100 MG tablet; Take 1 tablet (100 mg total) by mouth daily.  Dispense:  30 tablet; Refill: 11 - Refill hydrochlorothiazide (HYDRODIURIL) 25 MG tablet; Take 1 tablet (25 mg total) by mouth daily.  Dispense: 30 tablet; Refill: 11 - Begin aspirin 81 MG tablet; Take 1 tablet (81 mg total) by mouth daily.  Dispense: 30 tablet; Refill: 11 - Refill diltiazem (TIAZAC) 360 MG 24 hr capsule; Take 1 capsule (360 mg total) by mouth daily.  Dispense: 30 capsule; Refill: 11 - Comprehensive metabolic panel; Future - TSH; Future - CBC with Differential; Future - Lipid panel; Future  2. Generalized anxiety disorder - Increase PARoxetine (PAXIL) 40 MG tablet; Take 1 tablet (40 mg total) by mouth every morning.  Dispense: 30 tablet; Refill: 11  3. Type 2 diabetes mellitus without complication, without long-term current use of insulin ([facility]) - HgB A1c 5.9%   Meds ordered this encounter  Medications  . losartan (COZAAR) 100 MG tablet    Sig: Take 1 tablet (100 mg total) by mouth daily.    Dispense:  30 tablet    Refill:  11    Order Specific Question:   Supervising Provider    Answer:   Tresa Garter [id number]  . hydrochlorothiazide (HYDRODIURIL) 25 MG tablet    Sig: Take 1 tablet (25 mg total) by mouth daily.    Dispense:  30 tablet    Refill:  11    Order Specific Question:   Supervising Provider    Answer:   Tresa Garter [id number]  . aspirin 81 MG tablet    Sig: Take 1 tablet (81 mg total) by mouth daily.    Dispense:  30 tablet    Refill:  11    Order Specific Question:   Supervising Provider    Answer:   Tresa Garter [id number]  . diltiazem (TIAZAC) 360 MG 24 hr capsule    Sig: Take 1 capsule (360 mg total) by mouth daily.    Dispense:  30 capsule    Refill:  11    Order Specific Question:   Supervising Provider    Answer:   Tresa Garter [id number]  . PARoxetine (PAXIL) 40 MG tablet    Sig: Take 1 tablet (40 mg total) by mouth every morning.    Dispense:  30 tablet    Refill:  11    Order Specific Question:   Supervising Provider     Answer:   Tresa Garter [[id number]]    Follow-up: Return in about 3 months (around 09/25/2017) for Annual physical.   Clent Demark PA

## 2017-06-28 ENCOUNTER — Other Ambulatory Visit (INDEPENDENT_AMBULATORY_CARE_PROVIDER_SITE_OTHER): Payer: Self-pay

## 2017-06-28 DIAGNOSIS — I10 Essential (primary) hypertension: Secondary | ICD-10-CM

## 2017-06-29 LAB — CBC WITH DIFFERENTIAL/PLATELET
BASOS: 0 %
Basophils Absolute: 0 10*3/uL (ref 0.0–0.2)
EOS (ABSOLUTE): 0.3 10*3/uL (ref 0.0–0.4)
EOS: 3 %
HEMOGLOBIN: 13 g/dL (ref 11.1–15.9)
Hematocrit: 40.5 % (ref 34.0–46.6)
IMMATURE GRANS (ABS): 0 10*3/uL (ref 0.0–0.1)
IMMATURE GRANULOCYTES: 0 %
LYMPHS: 33 %
Lymphocytes Absolute: 3.2 10*3/uL — ABNORMAL HIGH (ref 0.7–3.1)
MCH: 26.7 pg (ref 26.6–33.0)
MCHC: 32.1 g/dL (ref 31.5–35.7)
MCV: 83 fL (ref 79–97)
MONOCYTES: 7 %
Monocytes Absolute: 0.7 10*3/uL (ref 0.1–0.9)
NEUTROS ABS: 5.4 10*3/uL (ref 1.4–7.0)
Neutrophils: 57 %
Platelets: 267 10*3/uL (ref 150–379)
RBC: 4.87 x10E6/uL (ref 3.77–5.28)
RDW: 16.3 % — ABNORMAL HIGH (ref 12.3–15.4)
WBC: 9.6 10*3/uL (ref 3.4–10.8)

## 2017-06-29 LAB — COMPREHENSIVE METABOLIC PANEL
A/G RATIO: 1.6 (ref 1.2–2.2)
ALBUMIN: 4.5 g/dL (ref 3.6–4.8)
ALK PHOS: 95 IU/L (ref 39–117)
ALT: 23 IU/L (ref 0–32)
AST: 17 IU/L (ref 0–40)
BILIRUBIN TOTAL: 0.3 mg/dL (ref 0.0–1.2)
BUN / CREAT RATIO: 28 (ref 12–28)
BUN: 38 mg/dL — AB (ref 8–27)
CHLORIDE: 103 mmol/L (ref 96–106)
CO2: 18 mmol/L — ABNORMAL LOW (ref 20–29)
Calcium: 9.7 mg/dL (ref 8.7–10.3)
Creatinine, Ser: 1.34 mg/dL — ABNORMAL HIGH (ref 0.57–1.00)
GFR calc non Af Amer: 43 mL/min/{1.73_m2} — ABNORMAL LOW (ref >59)
GFR, EST AFRICAN AMERICAN: 50 mL/min/{1.73_m2} — AB (ref >59)
GLUCOSE: 93 mg/dL (ref 65–99)
Globulin, Total: 2.8 g/dL (ref 1.5–4.5)
POTASSIUM: 4.8 mmol/L (ref 3.5–5.2)
Sodium: 136 mmol/L (ref 134–144)
Total Protein: 7.3 g/dL (ref 6.0–8.5)

## 2017-06-29 LAB — TSH: TSH: 1.02 u[IU]/mL (ref 0.450–4.500)

## 2017-06-29 LAB — LIPID PANEL
Chol/HDL Ratio: 3.1 ratio (ref 0.0–4.4)
Cholesterol, Total: 159 mg/dL (ref 100–199)
HDL: 51 mg/dL (ref >39)
LDL CALC: 74 mg/dL (ref 0–99)
Triglycerides: 169 mg/dL — ABNORMAL HIGH (ref 0–149)
VLDL CHOLESTEROL CAL: 34 mg/dL (ref 5–40)

## 2017-07-01 ENCOUNTER — Telehealth (INDEPENDENT_AMBULATORY_CARE_PROVIDER_SITE_OTHER): Payer: Self-pay

## 2017-07-01 NOTE — Telephone Encounter (Signed)
Patient aware of mildly elevated triglycerides and to get an OTC omega 3 fish oil pill, impaired renal function, controlled BP may help improve filtration. Nat Christen, CMA

## 2017-07-01 NOTE — Telephone Encounter (Signed)
-----   Message from Clent Demark, PA-C sent at 07/01/2017 12:33 PM EDT ----- Triglycerides are mildly elevated. I recommend OTC omega 3 fish oil pills. Renal function is impaired but hopefully controlling BP would help improve filtration.

## 2017-07-25 MED FILL — LOSARTAN POTASSIUM 100 MG T: 100 | 30 days supply | Qty: 30 | Fill #1

## 2017-07-25 MED FILL — PARoxetine HCL 40 MG TABS: 40 | 30 days supply | Qty: 30 | Fill #1

## 2017-07-25 MED FILL — DILTIAZEM 24HR CD 360 MG CA: 360 | 30 days supply | Qty: 30 | Fill #0

## 2017-07-25 MED FILL — HYDROCHLOROTHIAZIDE 25 MG T: 25 | 30 days supply | Qty: 30 | Fill #1

## 2017-08-26 MED FILL — dilTIAZem HCL ER COATED BEA: 360 | 30 days supply | Qty: 30 | Fill #1

## 2017-08-26 MED FILL — PARoxetine HCL 40 MG TABS: 40 | 30 days supply | Qty: 30 | Fill #2

## 2017-08-26 MED FILL — HYDROCHLOROTHIAZIDE 25 MG T: 25 | 30 days supply | Qty: 30 | Fill #2

## 2017-08-26 MED FILL — LOSARTAN POTASSIUM 100 MG T: 100 | 30 days supply | Qty: 30 | Fill #2

## 2017-09-23 MED FILL — HYDROCHLOROTHIAZIDE 25 MG T: 25 | 30 days supply | Qty: 30 | Fill #3

## 2017-09-23 MED FILL — LOSARTAN POTASSIUM 100 MG T: 100 | 30 days supply | Qty: 30 | Fill #3

## 2017-09-23 MED FILL — dilTIAZem HCL ER COATED BEA: 360 | 30 days supply | Qty: 30 | Fill #2

## 2017-09-23 MED FILL — PARoxetine HCL 40 MG TABS: 40 | 30 days supply | Qty: 30 | Fill #3

## 2017-09-26 ENCOUNTER — Other Ambulatory Visit: Payer: Self-pay

## 2017-09-26 ENCOUNTER — Encounter (INDEPENDENT_AMBULATORY_CARE_PROVIDER_SITE_OTHER): Payer: Self-pay | Admitting: Physician Assistant

## 2017-09-26 ENCOUNTER — Other Ambulatory Visit (HOSPITAL_COMMUNITY)
Admission: RE | Admit: 2017-09-26 | Discharge: 2017-09-26 | Disposition: A | Discharge status: Home / Self Care | Payer: Medicaid Other | Source: Ambulatory Visit | Attending: Physician Assistant | Admitting: Physician Assistant

## 2017-09-26 ENCOUNTER — Ambulatory Visit (INDEPENDENT_AMBULATORY_CARE_PROVIDER_SITE_OTHER): Payer: Medicaid Other | Admitting: Physician Assistant

## 2017-09-26 VITALS — BP 139/78 | HR 71 | Temp 98.0°F | Ht 59.0 in | Wt 178.8 lb

## 2017-09-26 DIAGNOSIS — Z114 Encounter for screening for human immunodeficiency virus [HIV]: Secondary | ICD-10-CM

## 2017-09-26 DIAGNOSIS — Z124 Encounter for screening for malignant neoplasm of cervix: Secondary | ICD-10-CM

## 2017-09-26 DIAGNOSIS — Z309 Encounter for contraceptive management, unspecified: Secondary | ICD-10-CM

## 2017-09-26 DIAGNOSIS — Z1239 Encounter for other screening for malignant neoplasm of breast: Secondary | ICD-10-CM

## 2017-09-26 DIAGNOSIS — Z0000 Encounter for general adult medical examination without abnormal findings: Secondary | ICD-10-CM

## 2017-09-26 DIAGNOSIS — F411 Generalized anxiety disorder: Secondary | ICD-10-CM

## 2017-09-26 DIAGNOSIS — E119 Type 2 diabetes mellitus without complications: Secondary | ICD-10-CM

## 2017-09-26 DIAGNOSIS — Z1211 Encounter for screening for malignant neoplasm of colon: Secondary | ICD-10-CM

## 2017-09-26 DIAGNOSIS — Z1159 Encounter for screening for other viral diseases: Secondary | ICD-10-CM

## 2017-09-26 DIAGNOSIS — R7989 Other specified abnormal findings of blood chemistry: Secondary | ICD-10-CM

## 2017-09-26 DIAGNOSIS — Z23 Encounter for immunization: Secondary | ICD-10-CM

## 2017-09-26 MED ORDER — METFORMIN HCL 500 MG PO TABS: 500.0000 mg | ORAL_TABLET | Freq: Two times a day (BID) | ORAL | 11 refills | Status: AC

## 2017-09-26 MED ORDER — PAROXETINE HCL 40 MG PO TABS: 40.0000 mg | ORAL_TABLET | ORAL | 11 refills | Status: DC

## 2017-09-26 MED FILL — metFORMIN HCL 500 MG TABS: 500 | 15 days supply | Qty: 30 | Fill #0

## 2017-09-26 NOTE — Patient Instructions (Signed)
Pneumococcal Vaccine, Polyvalent solution for injection What is this medicine? PNEUMOCOCCAL VACCINE, POLYVALENT (NEU mo KOK al vak SEEN, pol ee VEY luhnt) is a vaccine to prevent pneumococcus bacteria infection. These bacteria are a major cause of ear infections, Strep throat infections, and serious pneumonia, meningitis, or blood infections worldwide. These vaccines help the body to produce antibodies (protective substances) that help your body defend against these bacteria. This vaccine is recommended for people 61 years of age and older with health problems. It is also recommended for all adults over 61 years old. This vaccine will not treat an infection. This medicine may be used for other purposes; ask your health care provider or pharmacist if you have questions. COMMON BRAND NAME(S): Pneumovax 23 What should I tell my health care provider before I take this medicine? They need to know if you have any of these conditions: -bleeding problems -bone marrow or organ transplant -cancer, Hodgkin's disease -fever -infection -immune system problems -low platelet count in the blood -seizures -an unusual or allergic reaction to pneumococcal vaccine, diphtheria toxoid, other vaccines, latex, other medicines, foods, dyes, or preservatives -pregnant or trying to get pregnant -breast-feeding How should I use this medicine? This vaccine is for injection into a muscle or under the skin. It is given by a health care professional. A copy of Vaccine Information Statements will be given before each vaccination. Read this sheet carefully each time. The sheet may change frequently. Talk to your pediatrician regarding the use of this medicine in children. While this drug may be prescribed for children as young as 61 years of age for selected conditions, precautions do apply. Overdosage: If you think you have taken too much of this medicine contact a poison control center or emergency room at once. NOTE: This  medicine is only for you. Do not share this medicine with others. What if I miss a dose? It is important not to miss your dose. Call your doctor or health care professional if you are unable to keep an appointment. What may interact with this medicine? -medicines for cancer chemotherapy -medicines that suppress your immune function -medicines that treat or prevent blood clots like warfarin, enoxaparin, and dalteparin -steroid medicines like prednisone or cortisone This list may not describe all possible interactions. Give your health care provider a list of all the medicines, herbs, non-prescription drugs, or dietary supplements you use. Also tell them if you smoke, drink alcohol, or use illegal drugs. Some items may interact with your medicine. What should I watch for while using this medicine? Mild fever and pain should go away in 3 days or less. Report any unusual symptoms to your doctor or health care professional. What side effects may I notice from receiving this medicine? Side effects that you should report to your doctor or health care professional as soon as possible: -allergic reactions like skin rash, itching or hives, swelling of the face, lips, or tongue -breathing problems -confused -fever over 102 degrees F -pain, tingling, numbness in the hands or feet -seizures -unusual bleeding or bruising -unusual muscle weakness Side effects that usually do not require medical attention (report to your doctor or health care professional if they continue or are bothersome): -aches and pains -diarrhea -fever of 102 degrees F or less -headache -irritable -loss of appetite -pain, tender at site where injected -trouble sleeping This list may not describe all possible side effects. Call your doctor for medical advice about side effects. You may report side effects to FDA at 1-800-FDA-1088. Where should  I keep my medicine? This does not apply. This vaccine is given in a clinic, pharmacy,  doctor's office, or other health care setting and will not be stored at home. NOTE: This sheet is a summary. It may not cover all possible information. If you have questions about this medicine, talk to your doctor, pharmacist, or health care provider.  2018 Elsevier/Gold Standard (2007-09-26 14:32:37)    Td Vaccine (Tetanus and Diphtheria): What You Need to Know 1. Why get vaccinated? Tetanus  and diphtheria are very serious diseases. They are rare in the [place] today, but people who do become infected often have severe complications. Td vaccine is used to protect adolescents and adults from both of these diseases. Both tetanus and diphtheria are infections caused by bacteria. Diphtheria spreads from person to person through coughing or sneezing. Tetanus-causing bacteria enter the body through cuts, scratches, or wounds. TETANUS (lockjaw) causes painful muscle tightening and stiffness, usually all over the body.  It can lead to tightening of muscles in the head and neck so you can't open your mouth, swallow, or sometimes even breathe. Tetanus kills about 1 out of every 10 people who are infected even after receiving the best medical care.  DIPHTHERIA can cause a thick coating to form in the back of the throat.  It can lead to breathing problems, paralysis, heart failure, and death.  Before vaccines, as many as 200,000 cases of diphtheria and hundreds of cases of tetanus were reported in the [place] each year. Since vaccination began, reports of cases for both diseases have dropped by about 99%. 2. Td vaccine Td vaccine can protect adolescents and adults from tetanus and diphtheria. Td is usually given as a booster dose every 10 years but it can also be given earlier after a severe and dirty wound or burn. Another vaccine, called Tdap, which protects against pertussis in addition to tetanus and diphtheria, is sometimes recommended instead of Td vaccine. Your doctor or the person  giving you the vaccine can give you more information. Td may safely be given at the same time as other vaccines. 3. Some people should not get this vaccine  A person who has ever had a life-threatening allergic reaction after a previous dose of any tetanus or diphtheria containing vaccine, OR has a severe allergy to any part of this vaccine, should not get Td vaccine. Tell the person giving the vaccine about any severe allergies.  Talk to your doctor if you: ? had severe pain or swelling after any vaccine containing diphtheria or tetanus, ? ever had a condition called Guillain Barre Syndrome (GBS), ? aren't feeling well on the day the shot is scheduled. 4. What are the risks from Td vaccine? With any medicine, including vaccines, there is a chance of side effects. These are usually mild and go away on their own. Serious reactions are also possible but are rare. Most people who get Td vaccine do not have any problems with it. Mild problems following Td vaccine: (Did not interfere with activities)  Pain where the shot was given (about 8 people in 10)  Redness or swelling where the shot was given (about 1 person in 4)  Mild fever (rare)  Headache (about 1 person in 4)  Tiredness (about 1 person in 4)  Moderate problems following Td vaccine: (Interfered with activities, but did not require medical attention)  Fever over 102F (rare)  Severe problems following Td vaccine: (Unable to perform usual activities; required medical attention)  Swelling, severe  pain, bleeding and/or redness in the arm where the shot was given (rare).  Problems that could happen after any vaccine:  People sometimes faint after a medical procedure, including vaccination. Sitting or lying down for about 15 minutes can help prevent fainting, and injuries caused by a fall. Tell your doctor if you feel dizzy, or have vision changes or ringing in the ears.  Some people get severe pain in the shoulder and have  difficulty moving the arm where a shot was given. This happens very rarely.  Any medication can cause a severe allergic reaction. Such reactions from a vaccine are very rare, estimated at fewer than 1 in a million doses, and would happen within a few minutes to a few hours after the vaccination. As with any medicine, there is a very remote chance of a vaccine causing a serious injury or death. The safety of vaccines is always being monitored. For more information, visit: http://www.aguilar.org/ 5. What if there is a serious reaction? What should I look for? Look for anything that concerns you, such as signs of a severe allergic reaction, very high fever, or unusual behavior. Signs of a severe allergic reaction can include hives, swelling of the face and throat, difficulty breathing, a fast heartbeat, dizziness, and weakness. These would usually start a few minutes to a few hours after the vaccination. What should I do?  If you think it is a severe allergic reaction or other emergency that can't wait, call 9-1-1 or get the person to the nearest hospital. Otherwise, call your doctor.  Afterward, the reaction should be reported to the Vaccine Adverse Event Reporting System (VAERS). Your doctor might file this report, or you can do it yourself through the VAERS web site at www.vaers.SamedayNews.es, or by calling (661)188-9265. ? VAERS does not give medical advice. 6. The National Vaccine Injury Compensation Program The [employer] Vaccine Injury Compensation Program (VICP) is a federal program that was created to compensate people who may have been injured by certain vaccines. Persons who believe they may have been injured by a vaccine can learn about the program and about filing a claim by calling 425-261-4905 or visiting the [place] website at GoldCloset.com.ee. There is a time limit to file a claim for compensation. 7. How can I learn more?  Ask your doctor. He or she can give you the  vaccine package insert or suggest other sources of information.  Call your local or state health department.  Contact the Centers for Disease Control and Prevention (CDC): ? Call (475)255-0707 (1-800-CDC-INFO) ? Visit CDC's website at http://hunter.com/ CDC Td Vaccine VIS (06/14/15) This information is not intended to replace advice given to you by your health care provider. Make sure you discuss any questions you have with your health care provider. Document Released: 12/17/2005 Document Revised: 11/10/2015 Document Reviewed: 11/10/2015 Elsevier Interactive Patient Education  2017 [employer].

## 2017-09-26 NOTE — Progress Notes (Signed)
Subjective:  Patient ID: Shelly Garza, female    DOB: 08-19-1955  Age: 62 y.o. MRN: [id number]  CC:  Annual physical exam and PAP smear  HPI Shelly Garza is a 62 y.o. female with a medical history of DM2, HTN, OSA, dyspnea on exertion, CTS, trochanteric bursitis, and chronic left knee pain presents for an annual physical exam. Requests refills for Metformin and Paroxetine. Does not endorse any symptoms besides bilateral hip pain. XR of hips normal. Diagnosed with trochanteric bursitis by orthopedics.      Outpatient Medications Prior to Visit  Medication Sig Dispense Refill  . aspirin 81 MG tablet Take 1 tablet (81 mg total) by mouth daily. 30 tablet 11  . diltiazem (TIAZAC) 360 MG 24 hr capsule Take 1 capsule (360 mg total) by mouth daily. 30 capsule 11  . hydrochlorothiazide (HYDRODIURIL) 25 MG tablet Take 1 tablet (25 mg total) by mouth daily. 30 tablet 11  . losartan (COZAAR) 100 MG tablet Take 1 tablet (100 mg total) by mouth daily. 30 tablet 11  . metFORMIN (GLUCOPHAGE) 500 MG tablet Take 500 mg by mouth 2 (two) times daily with a meal.    . PARoxetine (PAXIL) 40 MG tablet Take 1 tablet (40 mg total) by mouth every morning. 30 tablet 11  . allopurinol (ZYLOPRIM) 100 MG tablet Take 100 mg by mouth daily.  5  . atorvastatin (LIPITOR) 20 MG tablet TAKE 1 TABLET BY MOUTH EVERYDAY AT BEDTIME  5   No facility-administered medications prior to visit.      ROS Review of Systems  Constitutional: Negative for chills, fever and malaise/fatigue.  Eyes: Negative for blurred vision.  Respiratory: Negative for shortness of breath.   Cardiovascular: Negative for chest pain and palpitations.  Gastrointestinal: Negative for abdominal pain and nausea.  Genitourinary: Negative for dysuria and hematuria.  Musculoskeletal: Negative for joint pain and myalgias.       Hip pain  Skin: Negative for rash.  Neurological: Negative for tingling and headaches.  Psychiatric/Behavioral:  Negative for depression. The patient is not nervous/anxious.     Objective:  BP 139/78 (BP Location: Left Arm, Patient Position: Sitting, Cuff Size: Large)   Pulse 71   Temp 98 F (36.7 C) (Oral)   Ht 4\' 11"  (1.499 m)   Wt 178 lb 12.8 oz (81.1 kg)   SpO2 97%   BMI 36.11 kg/m   BP/Weight 09/26/2017 6/57/8469 08/05/9526  Systolic BP 413 244 -  Diastolic BP 78 94 -  Wt. (Lbs) 178.8 180.2 177  BMI 36.11 36.4 35.75      Physical Exam  Constitutional: She is oriented to person, place, and time.  Well developed, obese, NAD, polite  HENT:  Head: Normocephalic and atraumatic.  Eyes: No scleral icterus.  Neck: Normal range of motion. Neck supple. No thyromegaly present.  Cardiovascular: Normal rate, regular rhythm and normal heart sounds.  Pulmonary/Chest: Effort normal and breath sounds normal.  Abdominal: Soft. Bowel sounds are normal. There is no tenderness.  Genitourinary:  Genitourinary Comments: No vaginal discharge, vaginal walls atrophic. Cervix appears normal with no motion tenderness. No adnexal mass or tenderness bilaterally. No uterine mass or tenderness bilaterally.  Musculoskeletal: She exhibits no edema.  Neurological: She is alert and oriented to person, place, and time.  Skin: Skin is warm and dry. No rash noted. No erythema. No pallor.  Psychiatric: She has a normal mood and affect. Her behavior is normal. Thought content normal.  Vitals reviewed.    Assessment &  Plan:   1. Annual physical exam - Most labs conducted three months ago with only mildly elevated triglycerides and mild serum creatinine elevation. Pt advised to continue taking Omega 3 fish oil pills. Will order BMP for serum creatinine.  2. Elevated serum creatinine - Basic Metabolic Panel  3. Type 2 diabetes mellitus without complication, without long-term current use of insulin (HCC) - Refill metFORMIN (GLUCOPHAGE) 500 MG tablet; Take 1 tablet (500 mg total) by mouth 2 (two) times daily with a  meal.  Dispense: 30 tablet; Refill: 11  4. Generalized anxiety disorder - Refill PARoxetine (PAXIL) 40 MG tablet; Take 1 tablet (40 mg total) by mouth every morning.  Dispense: 30 tablet; Refill: 11  5. Screening for cervical cancer - Cytology - PAP([facility])  6. Screening for breast cancer - MM DIGITAL SCREENING BILATERAL; Future - Ms. Brooke [employer] emailed to have patient enrolled in [place] program for free mammogram.  7. Screening for colon cancer - Fecal occult blood, imunochemical  8. Need for hepatitis C screening test - Hepatitis c antibody (reflex)  9. Need for pneumococcal vaccination - Pneumococcal polysaccharide vaccine 23-valent greater than or equal to 2yo subcutaneous/IM  10. Need for Tdap vaccination - Tdap vaccine greater than or equal to 7yo IM  11. Screening for HIV (human immunodeficiency virus) - HIV antibody   Meds ordered this encounter  Medications  . PARoxetine (PAXIL) 40 MG tablet    Sig: Take 1 tablet (40 mg total) by mouth every morning.    Dispense:  30 tablet    Refill:  11    Order Specific Question:   Supervising Provider    Answer:   Charlott Rakes [[id number]]  . metFORMIN (GLUCOPHAGE) 500 MG tablet    Sig: Take 1 tablet (500 mg total) by mouth 2 (two) times daily with a meal.    Dispense:  30 tablet    Refill:  11    Order Specific Question:   Supervising Provider    Answer:   Charlott Rakes [[id number]]    Follow-up: Return in about 6 months (around 03/29/2018) for DM2.   Clent Demark PA

## 2017-09-27 ENCOUNTER — Telehealth (INDEPENDENT_AMBULATORY_CARE_PROVIDER_SITE_OTHER): Payer: Self-pay

## 2017-09-27 LAB — CYTOLOGY - PAP
BACTERIAL VAGINITIS: NEGATIVE
CANDIDA VAGINITIS: NEGATIVE
CHLAMYDIA, DNA PROBE: NEGATIVE
DIAGNOSIS: NEGATIVE
Neisseria Gonorrhea: NEGATIVE
Trichomonas: NEGATIVE

## 2017-09-27 LAB — HIV ANTIBODY (ROUTINE TESTING W REFLEX): HIV Screen 4th Generation wRfx: NONREACTIVE

## 2017-09-27 LAB — BASIC METABOLIC PANEL
BUN / CREAT RATIO: 20 (ref 12–28)
BUN: 25 mg/dL (ref 8–27)
CO2: 24 mmol/L (ref 20–29)
CREATININE: 1.23 mg/dL — AB (ref 0.57–1.00)
Calcium: 9.9 mg/dL (ref 8.7–10.3)
Chloride: 101 mmol/L (ref 96–106)
GFR calc Af Amer: 55 mL/min/{1.73_m2} — ABNORMAL LOW (ref >59)
GFR calc non Af Amer: 48 mL/min/{1.73_m2} — ABNORMAL LOW (ref >59)
GLUCOSE: 131 mg/dL — AB (ref 65–99)
Potassium: 4 mmol/L (ref 3.5–5.2)
Sodium: 142 mmol/L (ref 134–144)

## 2017-09-27 LAB — HEPATITIS C ANTIBODY (REFLEX): HCV Ab: <0.1 s/co ratio (ref 0.0–0.9)

## 2017-09-27 LAB — HCV COMMENT:

## 2017-09-27 NOTE — Telephone Encounter (Signed)
-----   Message from Clent Demark, PA-C sent at 09/27/2017  8:35 AM EDT ----- HIV and HCV negative. Kidney filtration mildly impaired but somewhat better than previous reading.

## 2017-09-27 NOTE — Telephone Encounter (Signed)
Patient is aware of negative HIV and HCV. Also aware of mildly impaired kidney filtration that is somewhat better than the previous reading. Nat Christen, CMA

## 2017-10-01 ENCOUNTER — Telehealth (INDEPENDENT_AMBULATORY_CARE_PROVIDER_SITE_OTHER): Payer: Self-pay

## 2017-10-01 NOTE — Telephone Encounter (Signed)
Called patients phone twice and received same message that call can not be completed as dialed. Will try calling once more before mailing results. Nat Christen, CMA

## 2017-10-01 NOTE — Telephone Encounter (Signed)
-----   Message from Clent Demark, PA-C sent at 09/30/2017  8:24 AM EDT ----- Pap negative for malignancy and bacteria.

## 2017-10-03 ENCOUNTER — Other Ambulatory Visit (INDEPENDENT_AMBULATORY_CARE_PROVIDER_SITE_OTHER): Payer: Self-pay | Admitting: Physician Assistant

## 2017-10-03 ENCOUNTER — Telehealth (INDEPENDENT_AMBULATORY_CARE_PROVIDER_SITE_OTHER): Payer: Self-pay

## 2017-10-03 DIAGNOSIS — R195 Other fecal abnormalities: Secondary | ICD-10-CM

## 2017-10-03 LAB — FECAL OCCULT BLOOD, IMMUNOCHEMICAL: Fecal Occult Bld: POSITIVE — AB

## 2017-10-03 NOTE — Telephone Encounter (Signed)
Patient is aware that pap is negative for any malignancy and bacteria. However FIT positive and referral has been placed to GI for colonoscopy. Informed that GI will contact directly to schedule colonoscopy. Nat Christen, CMA

## 2017-10-10 ENCOUNTER — Encounter: Payer: Self-pay | Admitting: Gastroenterology

## 2017-10-24 MED FILL — LOSARTAN POTASSIUM 100 MG T: 100 | 30 days supply | Qty: 30 | Fill #4

## 2017-10-24 MED FILL — HYDROCHLOROTHIAZIDE 25 MG T: 25 | 30 days supply | Qty: 30 | Fill #4

## 2017-10-24 MED FILL — PARoxetine HCL 40 MG TABS: 40 | 30 days supply | Qty: 30 | Fill #4

## 2017-10-24 MED FILL — dilTIAZem HCL ER COATED BEA: 360 | 30 days supply | Qty: 30 | Fill #3

## 2017-10-24 MED FILL — metFORMIN HCL 500 MG TABS: 500 | 15 days supply | Qty: 30 | Fill #1

## 2017-11-25 MED FILL — metFORMIN HCL 500 MG TABS: 500 | 15 days supply | Qty: 30 | Fill #2

## 2017-11-25 MED FILL — HYDROCHLOROTHIAZIDE 25 MG T: 25 | 30 days supply | Qty: 30 | Fill #5

## 2017-11-25 MED FILL — PARoxetine HCL 40 MG TABS: 40 | 30 days supply | Qty: 30 | Fill #5

## 2017-11-25 MED FILL — LOSARTAN POTASSIUM 100 MG T: 100 | 30 days supply | Qty: 30 | Fill #5

## 2017-11-25 MED FILL — dilTIAZem HCL ER COATED BEA: 360 | 30 days supply | Qty: 30 | Fill #4

## 2017-12-19 ENCOUNTER — Ambulatory Visit: Payer: Medicaid Other | Admitting: Nurse Practitioner

## 2017-12-25 MED FILL — HYDROCHLOROTHIAZIDE 25 MG T: 25 | 30 days supply | Qty: 30 | Fill #6

## 2017-12-25 MED FILL — LOSARTAN POTASSIUM 100 MG T: 100 | 30 days supply | Qty: 30 | Fill #6

## 2017-12-25 MED FILL — dilTIAZem HCL ER COATED BEA: 360 | 30 days supply | Qty: 30 | Fill #5

## 2017-12-25 MED FILL — metFORMIN HCL 500 MG TABS: 500 | 15 days supply | Qty: 30 | Fill #3

## 2017-12-25 MED FILL — PARoxetine HCL 40 MG TABS: 40 | 30 days supply | Qty: 30 | Fill #6

## 2018-01-27 MED FILL — LOSARTAN POTASSIUM 100 MG T: 100 | 30 days supply | Qty: 30 | Fill #7

## 2018-01-27 MED FILL — dilTIAZem HCL ER COATED BEA: 360 | 30 days supply | Qty: 30 | Fill #6

## 2018-01-27 MED FILL — HYDROCHLOROTHIAZIDE 25 MG T: 25 | 30 days supply | Qty: 30 | Fill #7

## 2018-01-27 MED FILL — metFORMIN HCL 500 MG TABS: 500 | 15 days supply | Qty: 30 | Fill #4

## 2018-01-27 MED FILL — PARoxetine HCL 40 MG TABS: 40 | 30 days supply | Qty: 30 | Fill #7

## 2018-02-24 MED FILL — metFORMIN HCL 500 MG TABS: 500 | 15 days supply | Qty: 30 | Fill #5

## 2018-02-24 MED FILL — PARoxetine HCL 40 MG TABS: 40 | 30 days supply | Qty: 30 | Fill #8

## 2018-02-24 MED FILL — HYDROCHLOROTHIAZIDE 25 MG T: 25 | 30 days supply | Qty: 30 | Fill #8

## 2018-02-24 MED FILL — LOSARTAN POTASSIUM 100 MG T: 100 | 30 days supply | Qty: 30 | Fill #8

## 2018-02-24 MED FILL — dilTIAZem HCL ER COATED BEA: 360 | 30 days supply | Qty: 30 | Fill #7

## 2018-03-26 MED FILL — metFORMIN HCL 500 MG TABS: 500 | 15 days supply | Qty: 30 | Fill #6

## 2018-03-26 MED FILL — LOSARTAN POTASSIUM 100 MG T: 100 | 30 days supply | Qty: 30 | Fill #9

## 2018-03-26 MED FILL — HYDROCHLOROTHIAZIDE 25 MG T: 25 | 30 days supply | Qty: 30 | Fill #9

## 2018-03-26 MED FILL — dilTIAZem HCL ER COATED BEA: 360 | 30 days supply | Qty: 30 | Fill #8

## 2018-03-31 ENCOUNTER — Ambulatory Visit (INDEPENDENT_AMBULATORY_CARE_PROVIDER_SITE_OTHER): Payer: Medicaid Other | Admitting: Internal Medicine

## 2018-04-23 MED FILL — HYDROCHLOROTHIAZIDE 25 MG T: 25 | 30 days supply | Qty: 30 | Fill #10

## 2018-04-23 MED FILL — metFORMIN HCL 500 MG TABS: 500 | 15 days supply | Qty: 30 | Fill #7

## 2018-04-23 MED FILL — LOSARTAN POTASSIUM 100 MG T: 100 | 30 days supply | Qty: 30 | Fill #10

## 2018-04-23 MED FILL — dilTIAZem HCL ER COATED BEA: 360 | 30 days supply | Qty: 30 | Fill #9

## 2018-04-29 ENCOUNTER — Encounter: Payer: Self-pay | Admitting: Physician Assistant

## 2018-04-29 LAB — HM DIABETES EYE EXAM

## 2018-05-01 ENCOUNTER — Ambulatory Visit (INDEPENDENT_AMBULATORY_CARE_PROVIDER_SITE_OTHER): Payer: Medicaid Other | Admitting: Primary Care

## 2018-05-30 MED FILL — metFORMIN HCL 500 MG TABS: 500 | 15 days supply | Qty: 30 | Fill #8

## 2018-05-30 MED FILL — HYDROCHLOROTHIAZIDE 25 MG T: 25 | 30 days supply | Qty: 30 | Fill #11

## 2018-06-02 ENCOUNTER — Telehealth (INDEPENDENT_AMBULATORY_CARE_PROVIDER_SITE_OTHER): Payer: Self-pay | Admitting: *Deleted

## 2018-06-02 MED FILL — LOSARTAN POTASSIUM 100 MG T: 100 | 30 days supply | Qty: 30 | Fill #11

## 2018-06-02 NOTE — Telephone Encounter (Signed)
I called pt to prescreen for COVID 19 before appt 06/03/2018, [facility] to go over prescreening questions.

## 2018-06-03 ENCOUNTER — Ambulatory Visit (INDEPENDENT_AMBULATORY_CARE_PROVIDER_SITE_OTHER): Payer: BLUE CROSS/BLUE SHIELD | Admitting: Orthopaedic Surgery

## 2018-06-03 ENCOUNTER — Ambulatory Visit (INDEPENDENT_AMBULATORY_CARE_PROVIDER_SITE_OTHER): Payer: Self-pay

## 2018-06-03 ENCOUNTER — Encounter (INDEPENDENT_AMBULATORY_CARE_PROVIDER_SITE_OTHER): Payer: Self-pay | Admitting: Orthopaedic Surgery

## 2018-06-03 DIAGNOSIS — M25562 Pain in left knee: Secondary | ICD-10-CM

## 2018-06-03 MED ORDER — METHYLPREDNISOLONE ACETATE 40 MG/ML IJ SUSP
40.0000 mg | INTRAMUSCULAR | Status: AC | PRN
  Administered 2018-06-03: 40 mg via INTRA_ARTICULAR

## 2018-06-03 MED ORDER — LIDOCAINE HCL 1 % IJ SOLN
3.0000 mL | INTRAMUSCULAR | Status: AC | PRN
Start: 2018-06-03 — End: 2018-06-03
  Administered 2018-06-03: 3 mL

## 2018-06-03 NOTE — Progress Notes (Signed)
Office Visit Note   Patient: Shelly Garza           Date of Birth: November 15, 1955           MRN: [id number] Visit Date: 06/03/2018              Requested by: Kerin Perna, NP [address] [place], [place] [address] PCP: Kerin Perna, NP   Assessment & Plan: Visit Diagnoses:  1. Left knee pain, unspecified chronicity     Plan: See her back in 2 weeks to see what type of response she had to the injection.  An Ace bandage was applied today she will remove this tonight.  She is to be mindful of any mechanical symptoms that she may have.  Follow-Up Instructions: Return in about 2 weeks (around 06/17/2018).   Orders:  Orders Placed This Encounter  Procedures  . XR Knee 1-2 Views Left   No orders of the defined types were placed in this encounter.     Procedures: Large Joint Inj: L knee on 06/03/2018 2:09 PM Indications: pain Details: 22 G 1.5 in needle, anterolateral approach  Arthrogram: No  Medications: 3 mL lidocaine 1 %; 40 mg methylPREDNISolone acetate 40 MG/ML Aspirate: 10 mL yellow Outcome: tolerated well, no immediate complications Procedure, treatment alternatives, risks and benefits explained, specific risks discussed. Consent was given by the patient. Immediately prior to procedure a time out was called to verify the correct patient, procedure, equipment, support staff and site/side marked as required. Patient was prepped and draped in the usual sterile fashion.       Clinical Data: No additional findings.   Subjective: Chief Complaint  Patient presents with  . Left Knee - Pain    HPI Shelly Garza comes in today with new complaint of left knee pain for the last couple weeks no known injury.  She saw her over a year ago with right knee pain states the right knee is somewhat calmed down.  She is now having left knee pain with the a locking-like sensation in the knee.  She denies any giving way or painful popping.  She states she has been  playing with kids on the floor but had no known injury.  Notes some swelling about the knee.  She reports her diabetes to be under good control her glucose levels running from 112 to as high as 120 but no higher. Review of Systems Please see HPI otherwise negative  Objective: Vital Signs: There were no vitals taken for this visit.  Physical Exam General: Well-developed well-nourished female no acute distress mood and affect appropriate. Psych: Alert and oriented x3 Ortho Exam Bilateral knees good range of motion.  She has tenderness along the medial lateral joint line of the left knee.  Positive McMurray's on the left negative on the right.  Right knee no effusion abnormal warmth erythema.  Left knee no abnormal warmth or erythema.  Left knee with slight effusion.  No instability valgus varus stressing of either knee. Specialty Comments:  No specialty comments available.  Imaging: Xr Knee 1-2 Views Left  Result Date: 06/03/2018 Left knee AP and lateral views: No acute fracture.  Lateral meniscus with signs of chondrocalcinosis.  Otherwise knee is well-preserved.    PMFS History: Patient Active Problem List   Diagnosis Date Noted  . CTS (carpal tunnel syndrome) 06/26/2017  . Snoring 09/21/2015  . Morbid obesity due to excess calories ([place]) 08/12/2015  . OSA (obstructive sleep apnea) 08/11/2015  . Essential  hypertension 08/11/2015  . Murmur 09/02/2014  . DOE (dyspnea on exertion) 09/02/2014   Past Medical History:  Diagnosis Date  . Diabetes mellitus without complication ([place])   . Hypertension   . Sleep apnea     Family History  Problem Relation Age of Onset  . Breast cancer Mother   . Hypertension Father   . Breast cancer Sister     Past Surgical History:  Procedure Laterality Date  . BREAST LUMPECTOMY  early 33s  . CESAREAN SECTION     x2   Social History   Occupational History  . Occupation: Architectural technologist: [employer]  Tobacco Use  .  Smoking status: Never Smoker  . Smokeless tobacco: Never Used  Substance and Sexual Activity  . Alcohol use: Yes    Alcohol/week: 0.0 standard drinks    Comment: red wine (once a month)  . Drug use: No  . Sexual activity: Not on file

## 2018-06-06 MED FILL — dilTIAZem HCL ER COATED BEA: 360 | 30 days supply | Qty: 30 | Fill #10

## 2018-06-17 ENCOUNTER — Ambulatory Visit (INDEPENDENT_AMBULATORY_CARE_PROVIDER_SITE_OTHER): Payer: Medicaid Other | Admitting: Orthopaedic Surgery

## 2018-07-04 MED FILL — metFORMIN HCL 500 MG TABS: 500 | 15 days supply | Qty: 30 | Fill #9

## 2018-07-04 MED FILL — dilTIAZem HCL ER COATED BEA: 360 | 30 days supply | Qty: 30 | Fill #0

## 2018-08-04 ENCOUNTER — Other Ambulatory Visit: Payer: Self-pay | Admitting: Physical Therapy

## 2018-08-04 ENCOUNTER — Other Ambulatory Visit: Payer: Self-pay | Admitting: Family Medicine

## 2018-08-04 ENCOUNTER — Other Ambulatory Visit: Payer: Self-pay | Admitting: Infectious Disease

## 2018-08-04 DIAGNOSIS — Z1231 Encounter for screening mammogram for malignant neoplasm of breast: Secondary | ICD-10-CM

## 2018-09-04 ENCOUNTER — Telehealth: Payer: Self-pay | Admitting: Orthopaedic Surgery

## 2018-09-04 NOTE — Telephone Encounter (Signed)
Records refaxed [facility] Dept. [phone]/initially faxed 08/07/2018

## 2018-09-10 ENCOUNTER — Other Ambulatory Visit: Payer: Self-pay

## 2018-09-10 ENCOUNTER — Ambulatory Visit (INDEPENDENT_AMBULATORY_CARE_PROVIDER_SITE_OTHER): Payer: BLUE CROSS/BLUE SHIELD | Admitting: Orthopaedic Surgery

## 2018-09-10 ENCOUNTER — Encounter: Payer: Self-pay | Admitting: Orthopaedic Surgery

## 2018-09-10 DIAGNOSIS — G8929 Other chronic pain: Secondary | ICD-10-CM

## 2018-09-10 DIAGNOSIS — M25562 Pain in left knee: Secondary | ICD-10-CM

## 2018-09-10 NOTE — Progress Notes (Signed)
HPI: [name] comes in today for follow-up of her left knee pain.  She states that the knee pain did not get any better with the cortisone injection.  States she still having significant amount of pain in the knee mainly medial aspect of the knee.  She notes that she is having some problems with the knee particularly whenever she goes from a sitting to standing position.  She also has problems getting the knee completely straight.  Review of systems: See HPI otherwise negative.  Physical exam: General well-developed well-nourished female no acute distress.  Ambulates without any assistive device.  In antalgic gait on left. Left knee: She has difficulty fully extending the knee I can bring the knee to full extension.  Full flexion.  Tenderness along medial joint line.  No instability valgus varus stressing.  McMurray's is positive.  Left calf supple nontender.  Impression: Left knee pain  Plan: Due to the fact patient is failed conservative treatment and had just some mild changes on radiographs recommend MRI to rule out meniscal tear.  Have her follow-up after the MRI to go over results discuss further treatment.  She can try an over-the-counter knee brace in the interim.  Questions encouraged and answered.

## 2018-09-24 ENCOUNTER — Ambulatory Visit: Payer: BLUE CROSS/BLUE SHIELD | Admitting: Orthopaedic Surgery

## 2018-10-08 ENCOUNTER — Ambulatory Visit: Payer: Medicaid Other

## 2018-10-11 ENCOUNTER — Ambulatory Visit
Admission: RE | Admit: 2018-10-11 | Discharge: 2018-10-11 | Disposition: A | Discharge status: Home / Self Care | Payer: BLUE CROSS/BLUE SHIELD | Source: Ambulatory Visit | Attending: Orthopaedic Surgery | Admitting: Orthopaedic Surgery

## 2018-10-11 ENCOUNTER — Other Ambulatory Visit: Payer: Self-pay

## 2018-10-11 DIAGNOSIS — G8929 Other chronic pain: Secondary | ICD-10-CM

## 2018-10-16 ENCOUNTER — Encounter: Payer: Self-pay | Admitting: Orthopaedic Surgery

## 2018-10-16 ENCOUNTER — Ambulatory Visit (INDEPENDENT_AMBULATORY_CARE_PROVIDER_SITE_OTHER): Payer: BLUE CROSS/BLUE SHIELD | Admitting: Orthopaedic Surgery

## 2018-10-16 ENCOUNTER — Telehealth: Payer: Self-pay

## 2018-10-16 DIAGNOSIS — M1712 Unilateral primary osteoarthritis, left knee: Secondary | ICD-10-CM

## 2018-10-16 DIAGNOSIS — M25561 Pain in right knee: Secondary | ICD-10-CM

## 2018-10-16 DIAGNOSIS — M25562 Pain in left knee: Secondary | ICD-10-CM | POA: Diagnosis not present

## 2018-10-16 DIAGNOSIS — M1711 Unilateral primary osteoarthritis, right knee: Secondary | ICD-10-CM

## 2018-10-16 DIAGNOSIS — G8929 Other chronic pain: Secondary | ICD-10-CM

## 2018-10-16 NOTE — Telephone Encounter (Signed)
Bilateral knee gel injections 

## 2018-10-16 NOTE — Progress Notes (Signed)
The patient comes in today to go over an MRI of her left knee.  She has had chronic pain in both of her knees and mainly the medial aspect of both knees.  Her plain films showed only just mild joint space narrowing at the medial aspect of both her knees.  She is tried and failed other conservative treatment measures including activity modification and weight loss as well as quad strengthening exercises.  We have placed steroid injections in her knees as well.  Her left knee was more symptomatic so we sent her for an MRI after very conservative treatment.  On examination both knees have slight varus malalignment.  Both knees have medial joint line tenderness but good range of motion and with slight patellofemoral crepitation.  The MRI of the left knee is shared with her.  She did have a moderate joint effusion but some of this may be the steroid was placed in her knee however there was no meniscal tear and her collateral and cruciate ligaments appear intact.  She had moderate cartilage thinning of the medial compartment of her knee but no full-thickness cartilage defect.  There was a partially ruptured Baker's cyst as well.  I explained to her what these MRI findings mean.  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  I gave her handout about hyaluronic acid.  All question concerns were answered addressed.  We will work on getting this ordered.

## 2018-10-22 NOTE — Telephone Encounter (Signed)
Noted  

## 2018-10-23 ENCOUNTER — Telehealth: Payer: Self-pay

## 2018-10-23 NOTE — Telephone Encounter (Signed)
Submitted VOB for SynviscOne, bilateral knee. 

## 2018-10-31 ENCOUNTER — Telehealth: Payer: Self-pay

## 2018-10-31 NOTE — Telephone Encounter (Signed)
PA required for SynviscOne, bilateral knee. Faxed completed PA form to [employer] at [phone]. 

## 2018-11-13 ENCOUNTER — Ambulatory Visit (INDEPENDENT_AMBULATORY_CARE_PROVIDER_SITE_OTHER): Payer: BLUE CROSS/BLUE SHIELD | Admitting: Orthopaedic Surgery

## 2018-11-13 ENCOUNTER — Telehealth: Payer: Self-pay

## 2018-11-13 ENCOUNTER — Ambulatory Visit
Admission: RE | Admit: 2018-11-13 | Discharge: 2018-11-13 | Disposition: A | Discharge status: Home / Self Care | Payer: BLUE CROSS/BLUE SHIELD | Source: Ambulatory Visit | Attending: Family Medicine | Admitting: Family Medicine

## 2018-11-13 ENCOUNTER — Other Ambulatory Visit: Payer: Self-pay

## 2018-11-13 ENCOUNTER — Other Ambulatory Visit: Payer: Self-pay | Admitting: Family Medicine

## 2018-11-13 ENCOUNTER — Encounter: Payer: Self-pay | Admitting: Orthopaedic Surgery

## 2018-11-13 DIAGNOSIS — I10 Essential (primary) hypertension: Secondary | ICD-10-CM

## 2018-11-13 DIAGNOSIS — M1712 Unilateral primary osteoarthritis, left knee: Secondary | ICD-10-CM | POA: Diagnosis not present

## 2018-11-13 DIAGNOSIS — M1711 Unilateral primary osteoarthritis, right knee: Secondary | ICD-10-CM

## 2018-11-13 MED ORDER — HYLAN G-F 20 48 MG/6ML IX SOSY
48.0000 mg | PREFILLED_SYRINGE | INTRA_ARTICULAR | Status: AC | PRN
  Administered 2018-11-13: 48 mg via INTRA_ARTICULAR

## 2018-11-13 NOTE — Progress Notes (Signed)
   Procedure Note  Patient: Shelly Garza             Date of Birth: 1956/02/23           MRN: [id number]             Visit Date: 11/13/2018  Procedures: Visit Diagnoses:  1. Unilateral primary osteoarthritis, left knee   2. Unilateral primary osteoarthritis, right knee     Large Joint Inj: R knee on 11/13/2018 3:21 PM Indications: pain and diagnostic evaluation Details: 22 G 1.5 in needle, superolateral approach  Arthrogram: No  Medications: 48 mg Hylan 48 MG/6ML Outcome: tolerated well, no immediate complications Procedure, treatment alternatives, risks and benefits explained, specific risks discussed. Consent was given by the patient. Immediately prior to procedure a time out was called to verify the correct patient, procedure, equipment, support staff and site/side marked as required. Patient was prepped and draped in the usual sterile fashion.   Large Joint Inj: L knee on 11/13/2018 3:21 PM Indications: pain and diagnostic evaluation Details: 22 G 1.5 in needle, superolateral approach  Arthrogram: No  Medications: 48 mg Hylan 48 MG/6ML Outcome: tolerated well, no immediate complications Procedure, treatment alternatives, risks and benefits explained, specific risks discussed. Consent was given by the patient. Immediately prior to procedure a time out was called to verify the correct patient, procedure, equipment, support staff and site/side marked as required. Patient was prepped and draped in the usual sterile fashion.    The patient comes in today for scheduled hyaluronic acid injections in both knees to treat the pain from osteoarthritis.  This will be with Synvisc 1.  She is tried failed other conservative treatment measures including anti-inflammatories, activity modification, quad strengthening exercises and steroid injections.  Both knees have daily pain in her globally.  She has good mobility of both knees.  On exam today there are no effusions of either knee.  Both  knees are ligamentously stable but do have joint line tenderness.  I did place Synvisc 1 in both knees today without difficulty.  All questions concerns were answered and addressed.  Follow-up will be as needed.

## 2018-11-13 NOTE — Telephone Encounter (Signed)
Approved for SynviscOne, bilateral knee. Buy & Bill Must meet deductible first Patient will be responsible for 60% OOP. No Co-pay PA required PA Approval# [id number] Valid 10/31/2018- 10/31/2019  Appt.11/13/2018 with Dr. Ninfa Linden

## 2018-11-25 ENCOUNTER — Ambulatory Visit: Payer: Medicaid Other

## 2019-07-10 ENCOUNTER — Ambulatory Visit
Admission: EM | Admit: 2019-07-10 | Discharge: 2019-07-10 | Disposition: A | Discharge status: Home / Self Care | Payer: BLUE CROSS/BLUE SHIELD

## 2019-07-10 ENCOUNTER — Encounter: Payer: Self-pay | Admitting: Emergency Medicine

## 2019-07-10 ENCOUNTER — Ambulatory Visit (INDEPENDENT_AMBULATORY_CARE_PROVIDER_SITE_OTHER): Payer: BLUE CROSS/BLUE SHIELD

## 2019-07-10 ENCOUNTER — Other Ambulatory Visit: Payer: Self-pay

## 2019-07-10 DIAGNOSIS — Z87891 Personal history of nicotine dependence: Secondary | ICD-10-CM

## 2019-07-10 DIAGNOSIS — J45909 Unspecified asthma, uncomplicated: Secondary | ICD-10-CM

## 2019-07-10 DIAGNOSIS — R05 Cough: Secondary | ICD-10-CM

## 2019-07-10 DIAGNOSIS — R059 Cough, unspecified: Secondary | ICD-10-CM

## 2019-07-10 MED ORDER — BENZONATATE 100 MG PO CAPS
100.0000 mg | ORAL_CAPSULE | Freq: Three times a day (TID) | ORAL | 0 refills | Status: DC
Start: 2019-07-10 — End: 2022-05-07

## 2019-07-10 MED ORDER — AEROCHAMBER PLUS FLO-VU MEDIUM MISC
1.0000 | Freq: Once | 0 refills | Status: AC
Start: 2019-07-10 — End: 2019-07-10

## 2019-07-10 MED ORDER — PREDNISONE 20 MG PO TABS
20.0000 mg | ORAL_TABLET | Freq: Every day | ORAL | 0 refills | Status: DC
Start: 2019-07-10 — End: 2022-04-09

## 2019-07-10 MED ORDER — CETIRIZINE HCL 10 MG PO TABS
10.0000 mg | ORAL_TABLET | Freq: Every day | ORAL | 0 refills | Status: AC
Start: 2019-07-10 — End: ?

## 2019-07-10 MED ORDER — FLUTICASONE PROPIONATE 50 MCG/ACT NA SUSP
1.0000 | Freq: Every day | NASAL | 0 refills | Status: AC
Start: 2019-07-10 — End: ?

## 2019-07-10 NOTE — ED Triage Notes (Addendum)
Cough x 1 week, patient reports green mucous. Pt is fully covid vaccinated.

## 2019-07-10 NOTE — Discharge Instructions (Addendum)

## 2019-07-10 NOTE — ED Provider Notes (Signed)
[facility]    CSN: [id number] Arrival date & time: 07/10/19  1051      History   Chief Complaint Chief Complaint  Patient presents with  . Cough    HPI Shelly Garza is a 64 y.o. female with history of OSA, hypertension, diabetes presenting for week course of productive cough.  Endorsing green mucus without blood.  Denies chest pain, difficulty breathing, lower extremity edema.  No fever, arthralgias, myalgias, known sick contacts.  Patient received second dose of Covid vaccine 4/27: Tolerated well.  Has been taking OTC mucolytic's without significant relief.   Past Medical History:  Diagnosis Date  . Diabetes mellitus without complication ([place])   . Hypertension   . Sleep apnea     Patient Active Problem List   Diagnosis Date Noted  . CTS (carpal tunnel syndrome) 06/26/2017  . Snoring 09/21/2015  . Morbid obesity due to excess calories ([place]) 08/12/2015  . OSA (obstructive sleep apnea) 08/11/2015  . Essential hypertension 08/11/2015  . Murmur 09/02/2014  . DOE (dyspnea on exertion) 09/02/2014    Past Surgical History:  Procedure Laterality Date  . BREAST LUMPECTOMY  early 4s  . CESAREAN SECTION     x2    OB History   No obstetric history on file.      Home Medications    Prior to Admission medications   Medication Sig Start Date End Date Taking? Authorizing Provider  albuterol (VENTOLIN HFA) 108 (90 Base) MCG/ACT inhaler Inhale into the lungs every 6 (six) hours as needed for wheezing or shortness of breath.   Yes [provider]  aspirin 81 MG tablet Take 1 tablet (81 mg total) by mouth daily. 06/26/17  Yes Clent Demark, PA-C  atorvastatin (LIPITOR) 20 MG tablet TAKE 1 TABLET BY MOUTH EVERYDAY AT BEDTIME 02/24/17  Yes [provider]  chlorthalidone (HYGROTON) 50 MG tablet Take by mouth daily.   Yes [provider]  diltiazem (TIAZAC) 360 MG 24 hr capsule Take 1 capsule (360 mg total) by mouth daily.  06/26/17  Yes Clent Demark, PA-C  doxazosin (CARDURA) 4 MG tablet Take 4 mg by mouth daily.   Yes [provider]  losartan (COZAAR) 100 MG tablet Take 1 tablet (100 mg total) by mouth daily. 06/26/17  Yes Clent Demark, PA-C  metFORMIN (GLUCOPHAGE) 500 MG tablet Take 1 tablet (500 mg total) by mouth 2 (two) times daily with a meal. 09/26/17  Yes Clent Demark, PA-C  PARoxetine (PAXIL) 40 MG tablet Take 1 tablet (40 mg total) by mouth every morning. 09/26/17  Yes Clent Demark, PA-C  benzonatate (TESSALON) 100 MG capsule Take 1 capsule (100 mg total) by mouth every 8 (eight) hours. 07/10/19   Hall-Potvin, [place], PA-C  cetirizine (ZYRTEC ALLERGY) 10 MG tablet Take 1 tablet (10 mg total) by mouth daily. 07/10/19   Hall-Potvin, [place], PA-C  fluticasone (FLONASE) 50 MCG/ACT nasal spray Place 1 spray into both nostrils daily. 07/10/19   Hall-Potvin, [place], PA-C  predniSONE (DELTASONE) 20 MG tablet Take 1 tablet (20 mg total) by mouth daily. 07/10/19   Hall-Potvin, [place], PA-C  Spacer/Aero-Holding Chambers (AEROCHAMBER PLUS FLO-VU MEDIUM) MISC 1 each by Other route once for 1 dose. 07/10/19 07/10/19  Hall-Potvin, [place], PA-C    Family History Family History  Problem Relation Age of Onset  . Breast cancer Mother   . Hypertension Father   . Breast cancer Sister     Social History Social History   Tobacco  Use  . Smoking status: Never Smoker  . Smokeless tobacco: Never Used  Substance Use Topics  . Alcohol use: Yes    Alcohol/week: 0.0 standard drinks    Comment: red wine (once a month)  . Drug use: No     Allergies   Patient has no known allergies.   Review of Systems As per HPI   Physical Exam Triage Vital Signs ED Triage Vitals  Enc Vitals Group     BP      Pulse      Resp      Temp      Temp src      SpO2      Weight      Height      Head Circumference      Peak Flow      Pain Score      Pain Loc      Pain Edu?      Excl. in [place]?     No data found.  Updated Vital Signs BP (!) 146/83   Pulse 83   Temp 98.8 F (37.1 C)   Resp 16   SpO2 94%   Visual Acuity Right Eye Distance:   Left Eye Distance:   Bilateral Distance:    Right Eye Near:   Left Eye Near:    Bilateral Near:     Physical Exam Constitutional:      General: She is not in acute distress.    Appearance: She is obese. She is not ill-appearing or diaphoretic.  HENT:     Head: Normocephalic and atraumatic.     Mouth/Throat:     Mouth: Mucous membranes are moist.     Pharynx: Oropharynx is clear. No oropharyngeal exudate or posterior oropharyngeal erythema.  Eyes:     General: No scleral icterus.    Conjunctiva/sclera: Conjunctivae normal.     Pupils: Pupils are equal, round, and reactive to light.  Neck:     Comments: Trachea midline, negative JVD Cardiovascular:     Rate and Rhythm: Normal rate and regular rhythm.     Heart sounds: No murmur. No gallop.   Pulmonary:     Effort: Pulmonary effort is normal. No respiratory distress.     Breath sounds: Wheezing present. No rhonchi or rales.  Musculoskeletal:     Cervical back: Neck supple. No tenderness.  Lymphadenopathy:     Cervical: No cervical adenopathy.  Skin:    Capillary Refill: Capillary refill takes less than 2 seconds.     Coloration: Skin is not jaundiced or pale.     Findings: No rash.  Neurological:     General: No focal deficit present.     Mental Status: She is alert and oriented to person, place, and time.      UC Treatments / Results  Labs (all labs ordered are listed, but only abnormal results are displayed) Labs Reviewed  NOVEL CORONAVIRUS, NAA    EKG   Radiology DG Chest 2 View  Result Date: 07/10/2019 CLINICAL DATA:  Productive cough for 1 week. Asthma. Former smoker. EXAM: CHEST - 2 VIEW COMPARISON:  11/13/2018 FINDINGS: The heart size and mediastinal contours are within normal limits. Aortic atherosclerosis noted. Both lungs are clear. The visualized  skeletal structures are unremarkable. IMPRESSION: No active cardiopulmonary disease. Electronically Signed   By: Marlaine Hind M.D.   On: 07/10/2019 11:55    Procedures Procedures (including critical care time)  Medications Ordered in UC Medications - No data to  display  Initial Impression / Assessment and Plan / UC Course  I have reviewed the triage vital signs and the nursing notes.  Pertinent labs & imaging results that were available during my care of the patient were reviewed by me and considered in my medical decision making (see chart for details).     Patient afebrile, nontoxic, with SpO2 94%.  Chest x-ray done office, reviewed by me and radiology: Compared to previous from 11/13/2018-negative for active cardiopulmonary disease.  Reviewed findings with patient verbalized understanding.  Covid PCR pending.  Patient to quarantine until results are back.  Patient states her last A1c was 6.2: Home sugar readings under 120.  We will treat supportively as outlined below.  Return precautions discussed, patient verbalized understanding and is agreeable to plan. Final Clinical Impressions(s) / UC Diagnoses   Final diagnoses:  Cough     Discharge Instructions     Tessalon for cough. Start flonase, atrovent nasal spray for nasal congestion/drainage. You can use over the counter nasal saline rinse such as neti pot for nasal congestion. Keep hydrated, your urine should be clear to pale yellow in color. Tylenol/motrin for fever and pain. Monitor for any worsening of symptoms, chest pain, shortness of breath, wheezing, swelling of the throat, go to the emergency department for further evaluation needed.     ED Prescriptions    Medication Sig Dispense Auth. Provider   benzonatate (TESSALON) 100 MG capsule Take 1 capsule (100 mg total) by mouth every 8 (eight) hours. 21 capsule Hall-Potvin, [place], PA-C   Spacer/Aero-Holding Chambers (AEROCHAMBER PLUS FLO-VU MEDIUM) MISC 1 each by Other route  once for 1 dose. 1 each Hall-Potvin, [place], PA-C   predniSONE (DELTASONE) 20 MG tablet Take 1 tablet (20 mg total) by mouth daily. 5 tablet Hall-Potvin, [place], PA-C   cetirizine (ZYRTEC ALLERGY) 10 MG tablet Take 1 tablet (10 mg total) by mouth daily. 30 tablet Hall-Potvin, [place], PA-C   fluticasone (FLONASE) 50 MCG/ACT nasal spray Place 1 spray into both nostrils daily. 16 g Hall-Potvin, [place], PA-C     PDMP not reviewed this encounter.   Hall-Potvin, [place], PA-C 07/10/19 1210

## 2019-07-11 LAB — SARS-COV-2, NAA 2 DAY TAT

## 2019-07-11 LAB — NOVEL CORONAVIRUS, NAA: SARS-CoV-2, NAA: NOT DETECTED

## 2020-02-07 IMAGING — CR DG CHEST 2V
2 series · 2 of 2 positions shown · non-contrast
Comparison: 08/11/2015

CLINICAL DATA: Shortness of breath for 3 months. Hypertension.
Diabetes.

EXAM:
CHEST - 2 VIEW

[w chest pa]
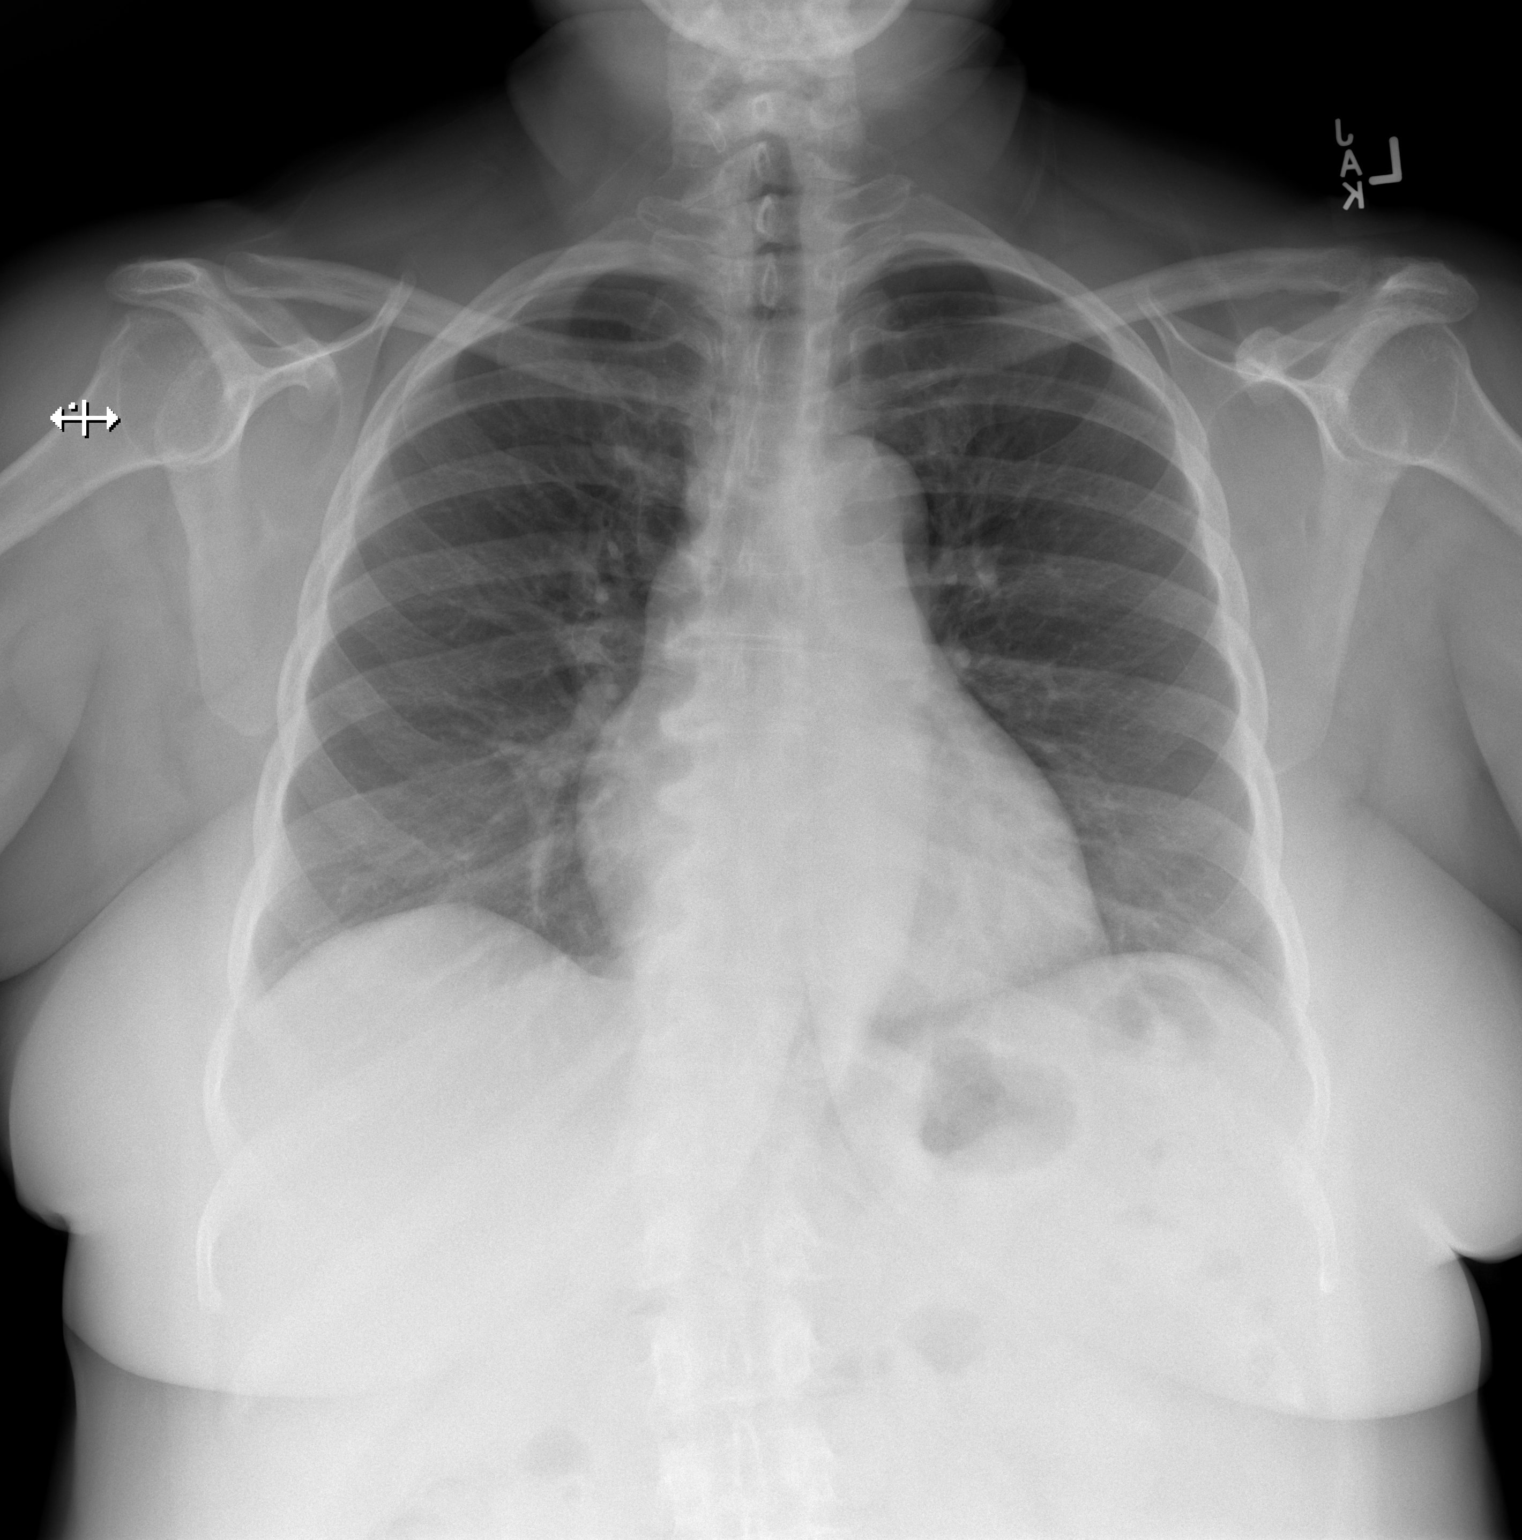

[w chest lat]
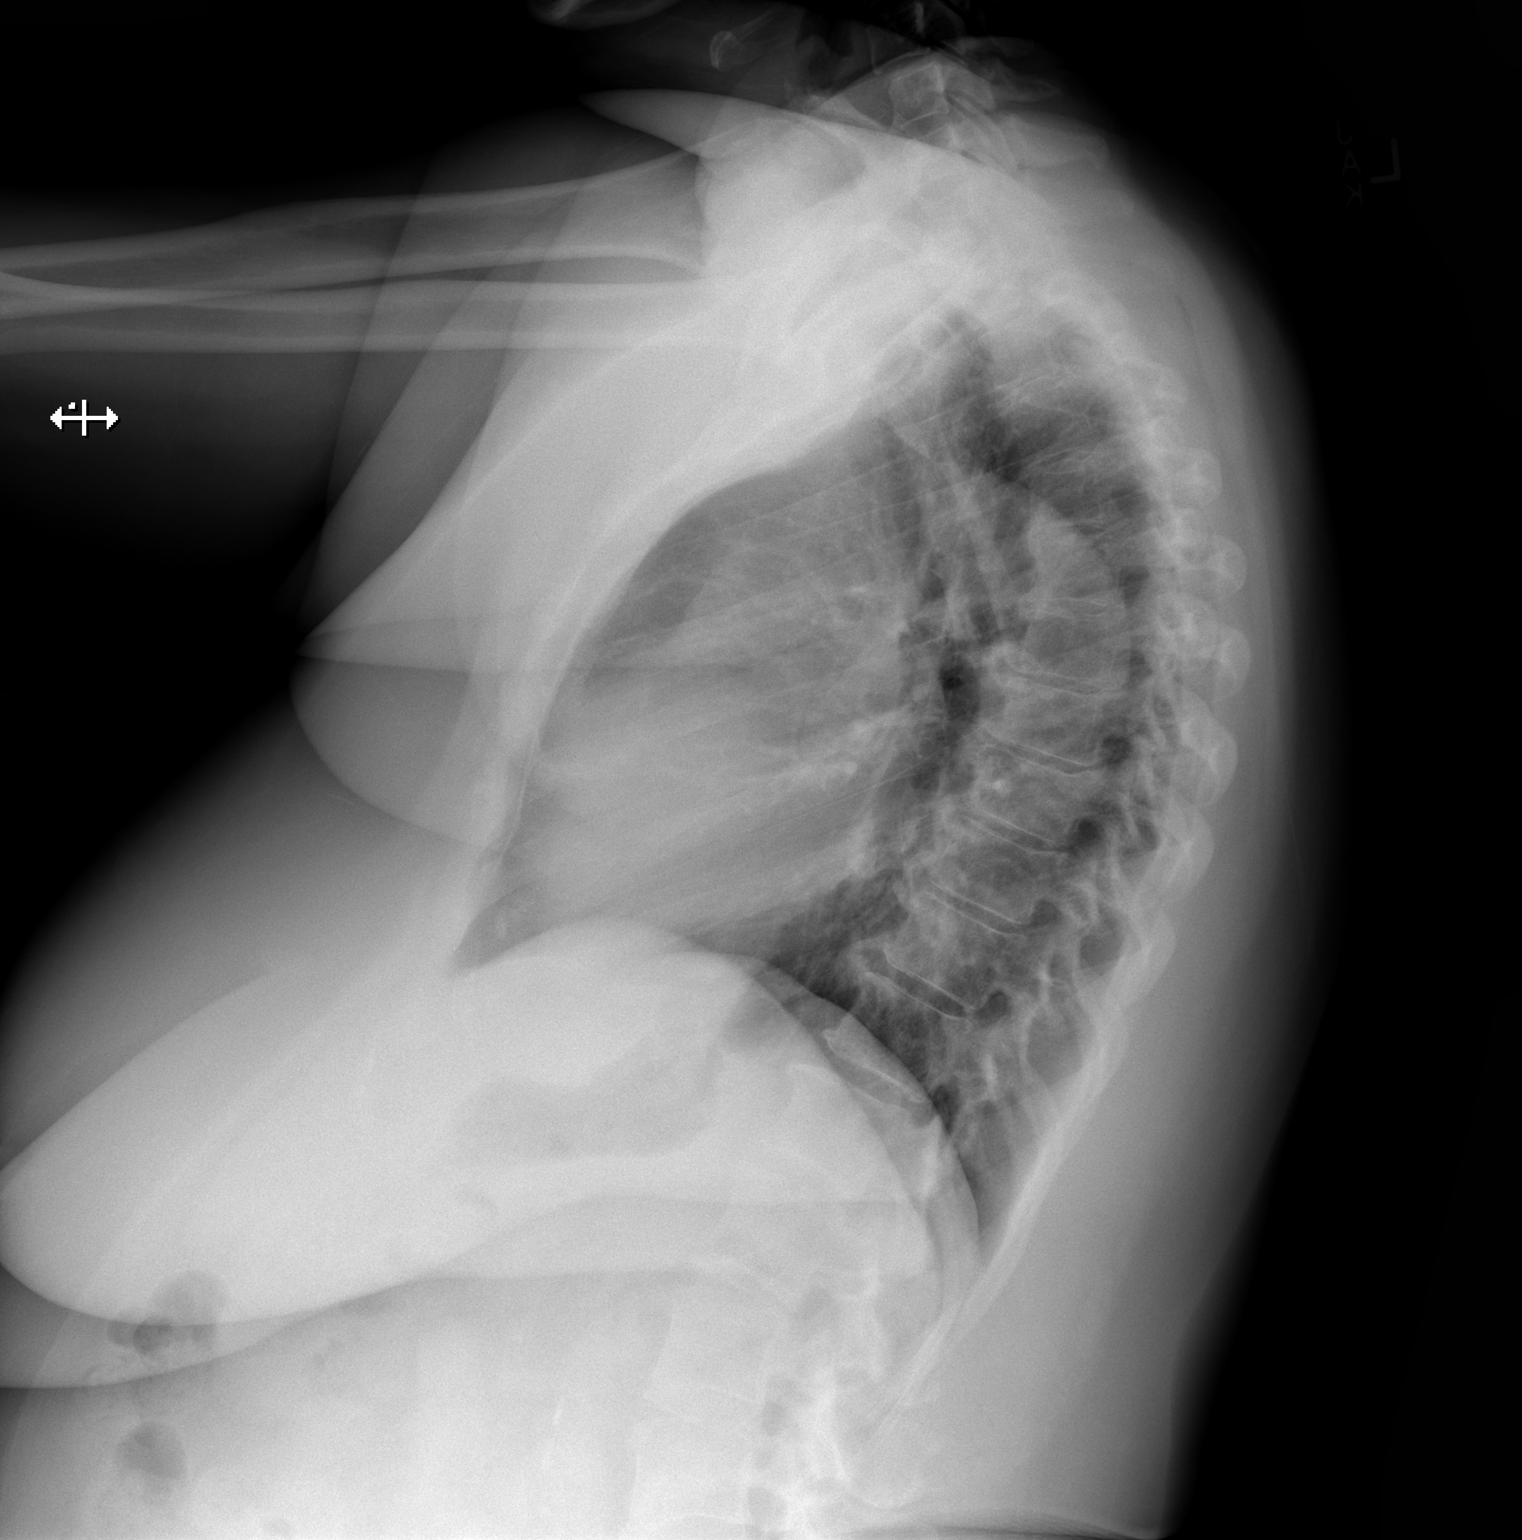

[2 of 2 positions shown; findings below may reference images not displayed]

FINDINGS: The heart size and mediastinal contours are within normal limits.
Aortic atherosclerosis. Both lungs are clear. The visualized
skeletal structures are unremarkable.
IMPRESSION: Stable exam.  No active cardiopulmonary disease.

## 2021-08-28 ENCOUNTER — Other Ambulatory Visit: Payer: Self-pay | Admitting: Internal Medicine

## 2021-08-28 DIAGNOSIS — N644 Mastodynia: Secondary | ICD-10-CM

## 2021-09-07 ENCOUNTER — Other Ambulatory Visit: Payer: Self-pay | Admitting: *Deleted

## 2021-09-07 ENCOUNTER — Ambulatory Visit (INDEPENDENT_AMBULATORY_CARE_PROVIDER_SITE_OTHER): Payer: Commercial Managed Care - HMO

## 2021-09-07 DIAGNOSIS — R002 Palpitations: Secondary | ICD-10-CM

## 2021-09-07 NOTE — Progress Notes (Unsigned)
Enrolled for [employer] to mail a ZIO XT long term holter monitor to the patients address on file.  

## 2021-09-11 DIAGNOSIS — R002 Palpitations: Secondary | ICD-10-CM | POA: Diagnosis not present

## 2021-09-25 ENCOUNTER — Ambulatory Visit
Admission: RE | Admit: 2021-09-25 | Discharge: 2021-09-25 | Disposition: A | Discharge status: Home / Self Care | Payer: Commercial Managed Care - HMO | Source: Ambulatory Visit | Attending: Internal Medicine | Admitting: Internal Medicine

## 2021-09-25 DIAGNOSIS — N644 Mastodynia: Secondary | ICD-10-CM

## 2021-12-07 ENCOUNTER — Other Ambulatory Visit: Payer: Self-pay | Admitting: Internal Medicine

## 2021-12-07 DIAGNOSIS — Z1231 Encounter for screening mammogram for malignant neoplasm of breast: Secondary | ICD-10-CM

## 2022-01-02 ENCOUNTER — Ambulatory Visit
Admission: RE | Admit: 2022-01-02 | Discharge: 2022-01-02 | Disposition: A | Discharge status: Home / Self Care | Payer: Medicare Other | Source: Ambulatory Visit | Attending: Internal Medicine | Admitting: Internal Medicine

## 2022-01-02 DIAGNOSIS — Z1231 Encounter for screening mammogram for malignant neoplasm of breast: Secondary | ICD-10-CM

## 2022-03-14 ENCOUNTER — Encounter: Payer: Self-pay | Admitting: Gastroenterology

## 2022-03-26 ENCOUNTER — Encounter (INDEPENDENT_AMBULATORY_CARE_PROVIDER_SITE_OTHER): Payer: Self-pay | Admitting: Ophthalmology

## 2022-04-09 ENCOUNTER — Ambulatory Visit (AMBULATORY_SURGERY_CENTER): Payer: Medicare Other | Admitting: *Deleted

## 2022-04-09 VITALS — Ht 59.0 in | Wt 182.0 lb

## 2022-04-09 DIAGNOSIS — Z1211 Encounter for screening for malignant neoplasm of colon: Secondary | ICD-10-CM

## 2022-04-09 MED ORDER — NA SULFATE-K SULFATE-MG SULF 17.5-3.13-1.6 GM/177ML PO SOLN: 1.0000 | Freq: Once | ORAL | 0 refills | Status: AC

## 2022-04-09 NOTE — Progress Notes (Signed)

## 2022-04-24 ENCOUNTER — Encounter: Payer: Self-pay | Admitting: Gastroenterology

## 2022-05-07 ENCOUNTER — Encounter: Payer: Self-pay | Admitting: Gastroenterology

## 2022-05-07 ENCOUNTER — Ambulatory Visit (AMBULATORY_SURGERY_CENTER): Payer: Medicare Other | Admitting: Gastroenterology

## 2022-05-07 VITALS — BP 125/76 | HR 70 | Temp 97.1°F | Resp 13 | Ht 59.0 in | Wt 182.0 lb

## 2022-05-07 DIAGNOSIS — D125 Benign neoplasm of sigmoid colon: Secondary | ICD-10-CM

## 2022-05-07 DIAGNOSIS — K635 Polyp of colon: Secondary | ICD-10-CM

## 2022-05-07 DIAGNOSIS — Z1211 Encounter for screening for malignant neoplasm of colon: Secondary | ICD-10-CM

## 2022-05-07 MED ORDER — SODIUM CHLORIDE 0.9 % IV SOLN: 500.0000 mL | Freq: Once | INTRAVENOUS | Status: DC

## 2022-05-07 NOTE — Patient Instructions (Signed)
Await pathology results.  Handouts on polyps, high fiber diet, and diverticulosis provided.  YOU HAD AN ENDOSCOPIC PROCEDURE TODAY AT [facility]:   Refer to the procedure report that was given to you for any specific questions about what was found during the examination.  If the procedure report does not answer your questions, please call your gastroenterologist to clarify.  If you requested that your care partner not be given the details of your procedure findings, then the procedure report has been included in a sealed envelope for you to review at your convenience later.  YOU SHOULD EXPECT: Some feelings of bloating in the abdomen. Passage of more gas than usual.  Walking can help get rid of the air that was put into your GI tract during the procedure and reduce the bloating. If you had a lower endoscopy (such as a colonoscopy or flexible sigmoidoscopy) you may notice spotting of blood in your stool or on the toilet paper. If you underwent a bowel prep for your procedure, you may not have a normal bowel movement for a few days.  Please Note:  You might notice some irritation and congestion in your nose or some drainage.  This is from the oxygen used during your procedure.  There is no need for concern and it should clear up in a day or so.  SYMPTOMS TO REPORT IMMEDIATELY:  Following lower endoscopy (colonoscopy or flexible sigmoidoscopy):  Excessive amounts of blood in the stool  Significant tenderness or worsening of abdominal pains  Swelling of the abdomen that is new, acute  Fever of 100F or higher    For urgent or emergent issues, a gastroenterologist can be reached at any hour by calling [phone]. Do not use MyChart messaging for urgent concerns.    DIET:  We do recommend a small meal at first, but then you may proceed to your regular diet.  Drink plenty of fluids but you should avoid alcoholic beverages for 24 hours.  ACTIVITY:  You should plan to take it  easy for the rest of today and you should NOT DRIVE or use heavy machinery until tomorrow (because of the sedation medicines used during the test).    FOLLOW UP: Our staff will call the number listed on your records the next business day following your procedure.  We will call around 7:15- 8:00 am to check on you and address any questions or concerns that you may have regarding the information given to you following your procedure. If we do not reach you, we will leave a message.     If any biopsies were taken you will be contacted by phone or by letter within the next 1-3 weeks.  Please call us at [phone] if you have not heard about the biopsies in 3 weeks.    SIGNATURES/CONFIDENTIALITY: You and/or your care partner have signed paperwork which will be entered into your electronic medical record.  These signatures attest to the fact that that the information above on your After Visit Summary has been reviewed and is understood.  Full responsibility of the confidentiality of this discharge information lies with you and/or your care-partner.

## 2022-05-07 NOTE — Progress Notes (Signed)
VS by DT  Pt's states no medical or surgical changes since previsit or office visit.  

## 2022-05-07 NOTE — Progress Notes (Signed)
Referring Provider: Michael Boston, MD Primary Care Physician:  Michael Boston, MD  Indication for Colonoscopy:  Colon cancer screening   IMPRESSION:  Need for colon cancer screening Appropriate candidate for monitored anesthesia care  PLAN: Colonoscopy in the [place] today   HPI: Shelly Garza is a 67 y.o. female presents for screening colonoscopy.  Colonoscopy with Dr. Collene Mares >10 years ago.   No known family history of colon cancer or polyps. No family history of uterine/endometrial cancer, pancreatic cancer or gastric/stomach cancer.   Past Medical History:  Diagnosis Date   Arthritis    back,knee,shouder   Diabetes mellitus without complication ([place])    Hyperlipidemia    Hypertension    Sleep apnea     Past Surgical History:  Procedure Laterality Date   BREAST LUMPECTOMY  early 53s   CESAREAN SECTION     x2    Current Outpatient Medications  Medication Sig Dispense Refill   albuterol (VENTOLIN HFA) 108 (90 Base) MCG/ACT inhaler Inhale into the lungs every 6 (six) hours as needed for wheezing or shortness of breath. (Patient not taking: Reported on 04/09/2022)     aspirin 81 MG tablet Take 1 tablet (81 mg total) by mouth daily. 30 tablet 11   atorvastatin (LIPITOR) 20 MG tablet TAKE 1 TABLET BY MOUTH EVERYDAY AT BEDTIME  5   benzonatate (TESSALON) 100 MG capsule Take 1 capsule (100 mg total) by mouth every 8 (eight) hours. 21 capsule 0   cetirizine (ZYRTEC ALLERGY) 10 MG tablet Take 1 tablet (10 mg total) by mouth daily. (Patient not taking: Reported on 04/09/2022) 30 tablet 0   chlorthalidone (HYGROTON) 50 MG tablet Take 25 mg by mouth daily.     diltiazem (TIAZAC) 360 MG 24 hr capsule Take 1 capsule (360 mg total) by mouth daily. 30 capsule 11   doxazosin (CARDURA) 4 MG tablet Take 4 mg by mouth daily.     FARXIGA 5 MG TABS tablet Take 5 mg by mouth daily.     fluticasone (FLONASE) 50 MCG/ACT nasal spray Place 1 spray into both nostrils daily. (Patient not taking:  Reported on 04/09/2022) 16 g 0   gabapentin (NEURONTIN) 300 MG capsule Take 300 mg by mouth daily.     losartan (COZAAR) 100 MG tablet Take 1 tablet (100 mg total) by mouth daily. 30 tablet 11   metFORMIN (GLUCOPHAGE) 500 MG tablet Take 1 tablet (500 mg total) by mouth 2 (two) times daily with a meal. 30 tablet 11   Multiple Vitamins-Minerals (MULTIVITAMIN GUMMIES ADULT PO) Take by mouth daily. Take 2 daily 50 plus     PARoxetine (PAXIL) 40 MG tablet Take 1 tablet (40 mg total) by mouth every morning. 30 tablet 11   rosuvastatin (CRESTOR) 20 MG tablet Take 20 mg by mouth at bedtime.     No current facility-administered medications for this visit.    Allergies as of 05/07/2022   (No Known Allergies)    Family History  Problem Relation Age of Onset   Breast cancer Mother    Hypertension Father    Breast cancer Sister    Colon cancer Neg Hx    Colon polyps Neg Hx    Crohn's disease Neg Hx    Esophageal cancer Neg Hx    Rectal cancer Neg Hx    Stomach cancer Neg Hx    Ulcerative colitis Neg Hx      Physical Exam: General:   Alert,  well-nourished, pleasant and cooperative in NAD  Head:  Normocephalic and atraumatic. Eyes:  Sclera clear, no icterus.   Conjunctiva pink. Mouth:  No deformity or lesions.   Neck:  Supple; no masses or thyromegaly. Lungs:  Clear throughout to auscultation.   No wheezes. Heart:  Regular rate and rhythm; no murmurs. Abdomen:  Soft, non-tender, nondistended, normal bowel sounds, no rebound or guarding.  Msk:  Symmetrical. No boney deformities LAD: No inguinal or umbilical LAD Extremities:  No clubbing or edema. Neurologic:  Alert and  oriented x4;  grossly nonfocal Skin:  No obvious rash or bruise. Psych:  Alert and cooperative. Normal mood and affect.     Studies/Results: No results found.    Baron Parmelee L. Tarri Glenn, MD, MPH 05/07/2022, 10:14 AM

## 2022-05-07 NOTE — Progress Notes (Signed)
Called to room to assist during endoscopic procedure.  Patient ID and intended procedure confirmed with present staff. Received instructions for my participation in the procedure from the performing physician.  

## 2022-05-07 NOTE — Op Note (Signed)
[facility] Patient Name: Izariah Ritzenthaler Procedure Date: 05/07/2022 11:06 AM MRN: [id number] Endoscopist: Thornton Park MD, MD, [id number] Age: 67 Referring MD:  Date of Birth: Oct 06, 1955 Gender: Female Account #: [id number] Procedure:                Colonoscopy Indications:              Screening for colorectal malignant neoplasm                           Prior colonoscopy >10 years ago                           No known family history of colon cancer or polyps Medicines:                Monitored Anesthesia Care Procedure:                Pre-Anesthesia Assessment:                           - Prior to the procedure, a History and Physical                            was performed, and patient medications and                            allergies were reviewed. The patient's tolerance of                            previous anesthesia was also reviewed. The risks                            and benefits of the procedure and the sedation                            options and risks were discussed with the patient.                            All questions were answered, and informed consent                            was obtained. Prior Anticoagulants: The patient has                            taken no anticoagulant or antiplatelet agents. ASA                            Grade Assessment: II - A patient with mild systemic                            disease. After reviewing the risks and benefits,                            the patient was deemed in satisfactory condition to  undergo the procedure.                           After obtaining informed consent, the colonoscope                            was passed under direct vision. Throughout the                            procedure, the patient's blood pressure, pulse, and                            oxygen saturations were monitored continuously. The                            Olympus CF-HQ190L  ([id number]) Colonoscope was                            introduced through the anus and advanced to the 2                            cm into the ileum. A second forward view of the                            right colon was performed. The colonoscopy was                            performed without difficulty. The patient tolerated                            the procedure well. The quality of the bowel                            preparation was good. The terminal ileum, ileocecal                            valve, appendiceal orifice, and rectum were                            photographed. Scope In: 11:23:57 AM Scope Out: 11:40:42 AM Scope Withdrawal Time: 0 hours 13 minutes 56 seconds  Total Procedure Duration: 0 hours 16 minutes 45 seconds  Findings:                 The perianal and digital rectal examinations were                            normal.                           Non-bleeding internal hemorrhoids were found.                           Multiple large-mouthed, medium-mouthed and  small-mouthed diverticula were found in the sigmoid                            colon and descending colon.                           A 2 mm polyp was found in the sigmoid colon. The                            polyp was flat. The polyp was removed with a cold                            snare. Resection and retrieval were complete.                            Estimated blood loss was minimal.                           The exam was otherwise without abnormality on                            direct and retroflexion views. Complications:            No immediate complications. Estimated Blood Loss:     Estimated blood loss was minimal. Impression:               - Non-bleeding internal hemorrhoids.                           - Diverticulosis in the sigmoid colon and in the                            descending colon.                           - One 2 mm polyp in the sigmoid colon,  removed with                            a cold snare. Resected and retrieved.                           - The examination was otherwise normal on direct                            and retroflexion views. Recommendation:           - Patient has a contact number available for                            emergencies. The signs and symptoms of potential                            delayed complications were discussed with the                            patient.  Return to normal activities tomorrow.                            Written discharge instructions were provided to the                            patient.                           - Resume previous diet.                           - Continue present medications.                           - Await pathology results.                           - Repeat colonoscopy date to be determined after                            pending pathology results are reviewed for                            surveillance.                           - Follow a high fiber diet. Drink at least 64                            ounces of water daily. Add a daily stool bulking                            agent such as psyllium (an exampled would be                            Metamucil).                           - Emerging evidence supports eating a diet of                            fruits, vegetables, grains, calcium, and yogurt                            while reducing red meat and alcohol may reduce the                            risk of colon cancer.                           - Thank you for allowing me to be involved in your                            colon cancer prevention. Thornton Park MD, MD 05/07/2022 11:48:07 AM This report has been signed  electronically.

## 2022-05-07 NOTE — Progress Notes (Signed)
Pt resting comfortably. VSS. Airway intact. SBAR complete to RN. All questions answered.   

## 2022-05-08 ENCOUNTER — Telehealth: Payer: Self-pay | Admitting: *Deleted

## 2022-05-08 NOTE — Telephone Encounter (Signed)
  Follow up Call-     05/07/2022   10:33 AM  Call back number  Post procedure Call Back phone  # [phone]  Permission to leave phone message Yes     Patient questions:  Do you have a fever, pain , or abdominal swelling? No. Pain Score  0 *  Have you tolerated food without any problems? Yes.    Have you been able to return to your normal activities? Yes.    Do you have any questions about your discharge instructions: Diet   No. Medications  No. Follow up visit  No.  Do you have questions or concerns about your Care? No.  Actions: * If pain score is 4 or above: No action needed, pain <4.

## 2022-05-11 ENCOUNTER — Encounter: Payer: Self-pay | Admitting: Gastroenterology

## 2022-09-13 DIAGNOSIS — N1831 Chronic kidney disease, stage 3a: Secondary | ICD-10-CM | POA: Diagnosis not present

## 2022-09-13 DIAGNOSIS — F419 Anxiety disorder, unspecified: Secondary | ICD-10-CM | POA: Diagnosis not present

## 2022-09-13 DIAGNOSIS — R809 Proteinuria, unspecified: Secondary | ICD-10-CM | POA: Diagnosis not present

## 2022-09-13 DIAGNOSIS — E1142 Type 2 diabetes mellitus with diabetic polyneuropathy: Secondary | ICD-10-CM | POA: Diagnosis not present

## 2022-09-13 DIAGNOSIS — Z87891 Personal history of nicotine dependence: Secondary | ICD-10-CM | POA: Diagnosis not present

## 2022-09-13 DIAGNOSIS — Z6833 Body mass index (BMI) 33.0-33.9, adult: Secondary | ICD-10-CM | POA: Diagnosis not present

## 2022-09-13 DIAGNOSIS — E669 Obesity, unspecified: Secondary | ICD-10-CM | POA: Diagnosis not present

## 2022-10-10 DIAGNOSIS — M7551 Bursitis of right shoulder: Secondary | ICD-10-CM | POA: Diagnosis not present

## 2022-10-10 DIAGNOSIS — M19011 Primary osteoarthritis, right shoulder: Secondary | ICD-10-CM | POA: Diagnosis not present

## 2022-10-10 DIAGNOSIS — M25511 Pain in right shoulder: Secondary | ICD-10-CM | POA: Diagnosis not present

## 2022-11-21 ENCOUNTER — Other Ambulatory Visit: Payer: Self-pay | Admitting: Internal Medicine

## 2022-11-21 DIAGNOSIS — Z1231 Encounter for screening mammogram for malignant neoplasm of breast: Secondary | ICD-10-CM

## 2022-12-18 DIAGNOSIS — M19011 Primary osteoarthritis, right shoulder: Secondary | ICD-10-CM | POA: Diagnosis not present

## 2023-01-04 ENCOUNTER — Ambulatory Visit: Payer: Medicare Other

## 2023-01-04 ENCOUNTER — Ambulatory Visit
Admission: RE | Admit: 2023-01-04 | Discharge: 2023-01-04 | Disposition: A | Discharge status: Home / Self Care | Payer: Medicare HMO | Source: Ambulatory Visit | Attending: Internal Medicine

## 2023-01-04 DIAGNOSIS — Z1231 Encounter for screening mammogram for malignant neoplasm of breast: Secondary | ICD-10-CM

## 2023-01-08 DIAGNOSIS — E1142 Type 2 diabetes mellitus with diabetic polyneuropathy: Secondary | ICD-10-CM | POA: Diagnosis not present

## 2023-01-08 DIAGNOSIS — N1831 Chronic kidney disease, stage 3a: Secondary | ICD-10-CM | POA: Diagnosis not present

## 2023-01-08 DIAGNOSIS — E785 Hyperlipidemia, unspecified: Secondary | ICD-10-CM | POA: Diagnosis not present

## 2023-01-08 DIAGNOSIS — Z1389 Encounter for screening for other disorder: Secondary | ICD-10-CM | POA: Diagnosis not present

## 2023-01-15 DIAGNOSIS — I700 Atherosclerosis of aorta: Secondary | ICD-10-CM | POA: Diagnosis not present

## 2023-01-15 DIAGNOSIS — Z87891 Personal history of nicotine dependence: Secondary | ICD-10-CM | POA: Diagnosis not present

## 2023-01-15 DIAGNOSIS — Z23 Encounter for immunization: Secondary | ICD-10-CM | POA: Diagnosis not present

## 2023-01-15 DIAGNOSIS — E119 Type 2 diabetes mellitus without complications: Secondary | ICD-10-CM | POA: Diagnosis not present

## 2023-01-15 DIAGNOSIS — H2513 Age-related nuclear cataract, bilateral: Secondary | ICD-10-CM | POA: Diagnosis not present

## 2023-01-15 DIAGNOSIS — I129 Hypertensive chronic kidney disease with stage 1 through stage 4 chronic kidney disease, or unspecified chronic kidney disease: Secondary | ICD-10-CM | POA: Diagnosis not present

## 2023-01-15 DIAGNOSIS — R809 Proteinuria, unspecified: Secondary | ICD-10-CM | POA: Diagnosis not present

## 2023-01-15 DIAGNOSIS — Z0000 Encounter for general adult medical examination without abnormal findings: Secondary | ICD-10-CM | POA: Diagnosis not present

## 2023-01-15 DIAGNOSIS — H33301 Unspecified retinal break, right eye: Secondary | ICD-10-CM | POA: Diagnosis not present

## 2023-01-15 DIAGNOSIS — N1831 Chronic kidney disease, stage 3a: Secondary | ICD-10-CM | POA: Diagnosis not present

## 2023-01-15 DIAGNOSIS — E785 Hyperlipidemia, unspecified: Secondary | ICD-10-CM | POA: Diagnosis not present

## 2023-01-15 DIAGNOSIS — E1142 Type 2 diabetes mellitus with diabetic polyneuropathy: Secondary | ICD-10-CM | POA: Diagnosis not present

## 2023-01-15 DIAGNOSIS — Z6834 Body mass index (BMI) 34.0-34.9, adult: Secondary | ICD-10-CM | POA: Diagnosis not present

## 2023-01-15 DIAGNOSIS — H524 Presbyopia: Secondary | ICD-10-CM | POA: Diagnosis not present

## 2023-01-15 DIAGNOSIS — E669 Obesity, unspecified: Secondary | ICD-10-CM | POA: Diagnosis not present

## 2023-01-17 ENCOUNTER — Other Ambulatory Visit (HOSPITAL_COMMUNITY): Payer: Self-pay | Admitting: Internal Medicine

## 2023-01-17 DIAGNOSIS — R002 Palpitations: Secondary | ICD-10-CM

## 2023-02-13 ENCOUNTER — Ambulatory Visit (HOSPITAL_COMMUNITY): Payer: Medicare HMO | Attending: Cardiology

## 2023-02-13 DIAGNOSIS — E119 Type 2 diabetes mellitus without complications: Secondary | ICD-10-CM | POA: Insufficient documentation

## 2023-02-13 DIAGNOSIS — G4730 Sleep apnea, unspecified: Secondary | ICD-10-CM | POA: Diagnosis not present

## 2023-02-13 DIAGNOSIS — R002 Palpitations: Secondary | ICD-10-CM | POA: Insufficient documentation

## 2023-02-13 DIAGNOSIS — E785 Hyperlipidemia, unspecified: Secondary | ICD-10-CM | POA: Insufficient documentation

## 2023-02-13 DIAGNOSIS — I10 Essential (primary) hypertension: Secondary | ICD-10-CM | POA: Diagnosis not present

## 2023-02-13 LAB — ECHOCARDIOGRAM COMPLETE
Calc EF: 58.2 %
S' Lateral: 2.81 cm
Single Plane A2C EF: 57.7 %
Single Plane A4C EF: 58.1 %

## 2023-11-24 ENCOUNTER — Encounter (HOSPITAL_BASED_OUTPATIENT_CLINIC_OR_DEPARTMENT_OTHER): Payer: Self-pay

## 2023-11-24 ENCOUNTER — Emergency Department (HOSPITAL_BASED_OUTPATIENT_CLINIC_OR_DEPARTMENT_OTHER)
Admission: EM | Admit: 2023-11-24 | Discharge: 2023-11-24 | Disposition: A | Discharge status: Home / Self Care | Attending: Emergency Medicine | Admitting: Emergency Medicine

## 2023-11-24 ENCOUNTER — Other Ambulatory Visit: Payer: Self-pay

## 2023-11-24 ENCOUNTER — Emergency Department (HOSPITAL_BASED_OUTPATIENT_CLINIC_OR_DEPARTMENT_OTHER)

## 2023-11-24 DIAGNOSIS — H9202 Otalgia, left ear: Secondary | ICD-10-CM | POA: Insufficient documentation

## 2023-11-24 DIAGNOSIS — I10 Essential (primary) hypertension: Secondary | ICD-10-CM | POA: Diagnosis not present

## 2023-11-24 DIAGNOSIS — Y92410 Unspecified street and highway as the place of occurrence of the external cause: Secondary | ICD-10-CM | POA: Insufficient documentation

## 2023-11-24 DIAGNOSIS — M7918 Myalgia, other site: Secondary | ICD-10-CM

## 2023-11-24 DIAGNOSIS — M79602 Pain in left arm: Secondary | ICD-10-CM | POA: Insufficient documentation

## 2023-11-24 DIAGNOSIS — R42 Dizziness and giddiness: Secondary | ICD-10-CM | POA: Insufficient documentation

## 2023-11-24 MED ORDER — METHOCARBAMOL 500 MG PO TABS: 500.0000 mg | ORAL_TABLET | Freq: Two times a day (BID) | ORAL | 0 refills | Status: AC
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
## 2023-12-06 ENCOUNTER — Other Ambulatory Visit: Payer: Self-pay | Admitting: Internal Medicine

## 2023-12-06 DIAGNOSIS — Z1231 Encounter for screening mammogram for malignant neoplasm of breast: Secondary | ICD-10-CM

## 2024-01-01 ENCOUNTER — Other Ambulatory Visit: Payer: Self-pay | Admitting: Internal Medicine

## 2024-01-01 DIAGNOSIS — E785 Hyperlipidemia, unspecified: Secondary | ICD-10-CM

## 2024-01-06 ENCOUNTER — Ambulatory Visit
Admission: RE | Admit: 2024-01-06 | Discharge: 2024-01-06 | Disposition: A | Discharge status: Home / Self Care | Source: Ambulatory Visit | Attending: Internal Medicine | Admitting: Internal Medicine

## 2024-01-06 DIAGNOSIS — Z1231 Encounter for screening mammogram for malignant neoplasm of breast: Secondary | ICD-10-CM
